# Patient Record
Sex: Male | Born: 1945 | Race: White | Hispanic: No | Marital: Single
Health system: Southern US, Community
[De-identification: ages and names within clinical notes are randomized; demographics above are authoritative.]

## PROBLEM LIST (undated history)

## (undated) DIAGNOSIS — I716 Thoracoabdominal aortic aneurysm, without rupture, unspecified: Secondary | ICD-10-CM

## (undated) DIAGNOSIS — J9612 Chronic respiratory failure with hypercapnia: Secondary | ICD-10-CM

## (undated) DIAGNOSIS — Z720 Tobacco use: Secondary | ICD-10-CM

## (undated) DIAGNOSIS — I2699 Other pulmonary embolism without acute cor pulmonale: Secondary | ICD-10-CM

## (undated) DIAGNOSIS — I70209 Unspecified atherosclerosis of native arteries of extremities, unspecified extremity: Secondary | ICD-10-CM

## (undated) DIAGNOSIS — J9611 Chronic respiratory failure with hypoxia: Secondary | ICD-10-CM

## (undated) DIAGNOSIS — I5030 Unspecified diastolic (congestive) heart failure: Secondary | ICD-10-CM

## (undated) DIAGNOSIS — I639 Cerebral infarction, unspecified: Secondary | ICD-10-CM

---

## 2019-04-10 ENCOUNTER — Emergency Department (HOSPITAL_COMMUNITY): Payer: Medicare Other

## 2019-04-10 ENCOUNTER — Inpatient Hospital Stay (HOSPITAL_COMMUNITY)
Admission: EM | Admit: 2019-04-10 | Discharge: 2019-04-12 | DRG: 189 | Disposition: A | Discharge status: Hospice | Payer: Medicare Other | Attending: Student | Admitting: Student

## 2019-04-10 ENCOUNTER — Other Ambulatory Visit: Payer: Self-pay

## 2019-04-10 ENCOUNTER — Encounter (HOSPITAL_COMMUNITY): Payer: Self-pay | Admitting: Pulmonary Disease

## 2019-04-10 DIAGNOSIS — Z515 Encounter for palliative care: Secondary | ICD-10-CM | POA: Diagnosis not present

## 2019-04-10 DIAGNOSIS — Z8673 Personal history of transient ischemic attack (TIA), and cerebral infarction without residual deficits: Secondary | ICD-10-CM | POA: Diagnosis not present

## 2019-04-10 DIAGNOSIS — Z6833 Body mass index (BMI) 33.0-33.9, adult: Secondary | ICD-10-CM

## 2019-04-10 DIAGNOSIS — E875 Hyperkalemia: Secondary | ICD-10-CM | POA: Diagnosis present

## 2019-04-10 DIAGNOSIS — I96 Gangrene, not elsewhere classified: Secondary | ICD-10-CM

## 2019-04-10 DIAGNOSIS — J441 Chronic obstructive pulmonary disease with (acute) exacerbation: Secondary | ICD-10-CM | POA: Diagnosis present

## 2019-04-10 DIAGNOSIS — I959 Hypotension, unspecified: Secondary | ICD-10-CM | POA: Diagnosis present

## 2019-04-10 DIAGNOSIS — J449 Chronic obstructive pulmonary disease, unspecified: Secondary | ICD-10-CM | POA: Diagnosis not present

## 2019-04-10 DIAGNOSIS — Z86711 Personal history of pulmonary embolism: Secondary | ICD-10-CM

## 2019-04-10 DIAGNOSIS — I70262 Atherosclerosis of native arteries of extremities with gangrene, left leg: Secondary | ICD-10-CM | POA: Diagnosis present

## 2019-04-10 DIAGNOSIS — I716 Thoracoabdominal aortic aneurysm, without rupture: Secondary | ICD-10-CM | POA: Diagnosis present

## 2019-04-10 DIAGNOSIS — I2782 Chronic pulmonary embolism: Secondary | ICD-10-CM | POA: Diagnosis not present

## 2019-04-10 DIAGNOSIS — I361 Nonrheumatic tricuspid (valve) insufficiency: Secondary | ICD-10-CM | POA: Diagnosis not present

## 2019-04-10 DIAGNOSIS — G4733 Obstructive sleep apnea (adult) (pediatric): Secondary | ICD-10-CM | POA: Diagnosis present

## 2019-04-10 DIAGNOSIS — J9621 Acute and chronic respiratory failure with hypoxia: Secondary | ICD-10-CM | POA: Diagnosis present

## 2019-04-10 DIAGNOSIS — J9602 Acute respiratory failure with hypercapnia: Secondary | ICD-10-CM | POA: Diagnosis not present

## 2019-04-10 DIAGNOSIS — Z7982 Long term (current) use of aspirin: Secondary | ICD-10-CM | POA: Diagnosis not present

## 2019-04-10 DIAGNOSIS — N170 Acute kidney failure with tubular necrosis: Secondary | ICD-10-CM | POA: Diagnosis present

## 2019-04-10 DIAGNOSIS — L03116 Cellulitis of left lower limb: Secondary | ICD-10-CM | POA: Diagnosis present

## 2019-04-10 DIAGNOSIS — E669 Obesity, unspecified: Secondary | ICD-10-CM | POA: Diagnosis present

## 2019-04-10 DIAGNOSIS — Z720 Tobacco use: Secondary | ICD-10-CM | POA: Diagnosis present

## 2019-04-10 DIAGNOSIS — J9622 Acute and chronic respiratory failure with hypercapnia: Secondary | ICD-10-CM | POA: Diagnosis present

## 2019-04-10 DIAGNOSIS — Z7901 Long term (current) use of anticoagulants: Secondary | ICD-10-CM | POA: Diagnosis not present

## 2019-04-10 DIAGNOSIS — I70201 Unspecified atherosclerosis of native arteries of extremities, right leg: Secondary | ICD-10-CM | POA: Diagnosis present

## 2019-04-10 DIAGNOSIS — I110 Hypertensive heart disease with heart failure: Secondary | ICD-10-CM | POA: Diagnosis present

## 2019-04-10 DIAGNOSIS — R0602 Shortness of breath: Secondary | ICD-10-CM | POA: Diagnosis present

## 2019-04-10 DIAGNOSIS — E872 Acidosis: Secondary | ICD-10-CM | POA: Diagnosis present

## 2019-04-10 DIAGNOSIS — I5032 Chronic diastolic (congestive) heart failure: Secondary | ICD-10-CM | POA: Diagnosis present

## 2019-04-10 DIAGNOSIS — Z20828 Contact with and (suspected) exposure to other viral communicable diseases: Secondary | ICD-10-CM | POA: Diagnosis present

## 2019-04-10 DIAGNOSIS — I712 Thoracic aortic aneurysm, without rupture: Secondary | ICD-10-CM | POA: Diagnosis present

## 2019-04-10 DIAGNOSIS — L0390 Cellulitis, unspecified: Secondary | ICD-10-CM | POA: Diagnosis not present

## 2019-04-10 DIAGNOSIS — I739 Peripheral vascular disease, unspecified: Secondary | ICD-10-CM | POA: Diagnosis not present

## 2019-04-10 DIAGNOSIS — Z86718 Personal history of other venous thrombosis and embolism: Secondary | ICD-10-CM

## 2019-04-10 DIAGNOSIS — I2699 Other pulmonary embolism without acute cor pulmonale: Secondary | ICD-10-CM | POA: Diagnosis present

## 2019-04-10 DIAGNOSIS — N179 Acute kidney failure, unspecified: Secondary | ICD-10-CM | POA: Diagnosis not present

## 2019-04-10 DIAGNOSIS — Z66 Do not resuscitate: Secondary | ICD-10-CM | POA: Diagnosis present

## 2019-04-10 DIAGNOSIS — I724 Aneurysm of artery of lower extremity: Secondary | ICD-10-CM | POA: Diagnosis present

## 2019-04-10 DIAGNOSIS — J9601 Acute respiratory failure with hypoxia: Secondary | ICD-10-CM

## 2019-04-10 DIAGNOSIS — Z7951 Long term (current) use of inhaled steroids: Secondary | ICD-10-CM

## 2019-04-10 HISTORY — DX: Thoracoabdominal aortic aneurysm, without rupture: I71.6

## 2019-04-10 HISTORY — DX: Chronic respiratory failure with hypercapnia: J96.12

## 2019-04-10 HISTORY — DX: Tobacco use: Z72.0

## 2019-04-10 HISTORY — DX: Other pulmonary embolism without acute cor pulmonale: I26.99

## 2019-04-10 HISTORY — DX: Unspecified diastolic (congestive) heart failure: I50.30

## 2019-04-10 HISTORY — DX: Chronic respiratory failure with hypoxia: J96.11

## 2019-04-10 HISTORY — DX: Cerebral infarction, unspecified: I63.9

## 2019-04-10 HISTORY — DX: Thoracoabdominal aortic aneurysm, without rupture, unspecified: I71.60

## 2019-04-10 HISTORY — DX: Unspecified atherosclerosis of native arteries of extremities, unspecified extremity: I70.209

## 2019-04-10 LAB — COMPREHENSIVE METABOLIC PANEL
ALT: 15 U/L (ref 0–44)
AST: 15 U/L (ref 15–41)
Albumin: 3 g/dL — ABNORMAL LOW (ref 3.5–5.0)
Alkaline Phosphatase: 59 U/L (ref 38–126)
Anion gap: 13 (ref 5–15)
BUN: 31 mg/dL — ABNORMAL HIGH (ref 8–23)
CO2: 24 mmol/L (ref 22–32)
Calcium: 8.6 mg/dL — ABNORMAL LOW (ref 8.9–10.3)
Chloride: 101 mmol/L (ref 98–111)
Creatinine, Ser: 1.5 mg/dL — ABNORMAL HIGH (ref 0.61–1.24)
GFR calc Af Amer: 54 mL/min — ABNORMAL LOW (ref >60)
GFR calc non Af Amer: 46 mL/min — ABNORMAL LOW (ref >60)
Glucose, Bld: 89 mg/dL (ref 70–99)
Potassium: 5.6 mmol/L — ABNORMAL HIGH (ref 3.5–5.1)
Sodium: 138 mmol/L (ref 135–145)
Total Bilirubin: 0.4 mg/dL (ref 0.3–1.2)
Total Protein: 6.1 g/dL — ABNORMAL LOW (ref 6.5–8.1)

## 2019-04-10 LAB — POCT I-STAT EG7
Acid-base deficit: 1 mmol/L (ref 0.0–2.0)
Bicarbonate: 29.1 mmol/L — ABNORMAL HIGH (ref 20.0–28.0)
Calcium, Ion: 1.16 mmol/L (ref 1.15–1.40)
HCT: 48 % (ref 39.0–52.0)
Hemoglobin: 16.3 g/dL (ref 13.0–17.0)
O2 Saturation: 43 %
Potassium: 5.6 mmol/L — ABNORMAL HIGH (ref 3.5–5.1)
Sodium: 136 mmol/L (ref 135–145)
TCO2: 31 mmol/L (ref 22–32)
pCO2, Ven: 70.4 mmHg (ref 44.0–60.0)
pH, Ven: 7.225 — ABNORMAL LOW (ref 7.250–7.430)
pO2, Ven: 30 mmHg — CL (ref 32.0–45.0)

## 2019-04-10 LAB — CBC WITH DIFFERENTIAL/PLATELET
Abs Immature Granulocytes: 0.26 10*3/uL — ABNORMAL HIGH (ref 0.00–0.07)
Basophils Absolute: 0.1 10*3/uL (ref 0.0–0.1)
Basophils Relative: 1 %
Eosinophils Absolute: 0.3 10*3/uL (ref 0.0–0.5)
Eosinophils Relative: 2 %
HCT: 51.1 % (ref 39.0–52.0)
Hemoglobin: 15.1 g/dL (ref 13.0–17.0)
Immature Granulocytes: 2 %
Lymphocytes Relative: 10 %
Lymphs Abs: 1.5 10*3/uL (ref 0.7–4.0)
MCH: 30.1 pg (ref 26.0–34.0)
MCHC: 29.5 g/dL — ABNORMAL LOW (ref 30.0–36.0)
MCV: 101.8 fL — ABNORMAL HIGH (ref 80.0–100.0)
Monocytes Absolute: 1.1 10*3/uL — ABNORMAL HIGH (ref 0.1–1.0)
Monocytes Relative: 7 %
Neutro Abs: 12.4 10*3/uL — ABNORMAL HIGH (ref 1.7–7.7)
Neutrophils Relative %: 78 %
Platelets: 295 10*3/uL (ref 150–400)
RBC: 5.02 MIL/uL (ref 4.22–5.81)
RDW: 16 % — ABNORMAL HIGH (ref 11.5–15.5)
WBC: 15.7 10*3/uL — ABNORMAL HIGH (ref 4.0–10.5)
nRBC: 0 % (ref 0.0–0.2)

## 2019-04-10 LAB — TROPONIN I (HIGH SENSITIVITY)
Troponin I (High Sensitivity): 5 ng/L (ref <18)
Troponin I (High Sensitivity): <2 ng/L (ref <18)

## 2019-04-10 LAB — LACTIC ACID, PLASMA: Lactic Acid, Venous: 1.8 mmol/L (ref 0.5–1.9)

## 2019-04-10 LAB — SARS CORONAVIRUS 2 BY RT PCR (HOSPITAL ORDER, PERFORMED IN ~~LOC~~ HOSPITAL LAB): SARS Coronavirus 2: NEGATIVE

## 2019-04-10 LAB — APTT: aPTT: 34 seconds (ref 24–36)

## 2019-04-10 LAB — CBG MONITORING, ED: Glucose-Capillary: 77 mg/dL (ref 70–99)

## 2019-04-10 LAB — HEPARIN LEVEL (UNFRACTIONATED): Heparin Unfractionated: <0.1 IU/mL — ABNORMAL LOW (ref 0.30–0.70)

## 2019-04-10 LAB — PHOSPHORUS: Phosphorus: 1.4 mg/dL — ABNORMAL LOW (ref 2.5–4.6)

## 2019-04-10 LAB — BRAIN NATRIURETIC PEPTIDE: B Natriuretic Peptide: 60.2 pg/mL (ref 0.0–100.0)

## 2019-04-10 LAB — LIPASE, BLOOD: Lipase: 22 U/L (ref 11–51)

## 2019-04-10 LAB — MAGNESIUM: Magnesium: 0.6 mg/dL — CL (ref 1.7–2.4)

## 2019-04-10 MED ORDER — SODIUM CHLORIDE 0.9 % IV SOLN
2.0000 g | Freq: Once | INTRAVENOUS | Status: AC
  Administered 2019-04-10: 2 g via INTRAVENOUS
  Filled 2019-04-10: qty 20

## 2019-04-10 MED ORDER — ONDANSETRON HCL 4 MG PO TABS: 4.0000 mg | ORAL_TABLET | Freq: Four times a day (QID) | ORAL | Status: DC | PRN

## 2019-04-10 MED ORDER — HEPARIN BOLUS VIA INFUSION
5000.0000 [IU] | Freq: Once | INTRAVENOUS | Status: AC
  Administered 2019-04-10: 5000 [IU] via INTRAVENOUS
  Filled 2019-04-10: qty 5000

## 2019-04-10 MED ORDER — SODIUM CHLORIDE 0.9 % IV BOLUS
500.0000 mL | Freq: Once | INTRAVENOUS | Status: AC
  Administered 2019-04-10: 500 mL via INTRAVENOUS

## 2019-04-10 MED ORDER — VANCOMYCIN HCL 10 G IV SOLR
2000.0000 mg | Freq: Once | INTRAVENOUS | Status: AC
  Administered 2019-04-10: 2000 mg via INTRAVENOUS
  Filled 2019-04-10: qty 2000

## 2019-04-10 MED ORDER — VANCOMYCIN HCL 10 G IV SOLR
1250.0000 mg | INTRAVENOUS | Status: DC
  Filled 2019-04-10: qty 1250

## 2019-04-10 MED ORDER — IPRATROPIUM-ALBUTEROL 0.5-2.5 (3) MG/3ML IN SOLN
3.0000 mL | Freq: Four times a day (QID) | RESPIRATORY_TRACT | Status: DC
  Administered 2019-04-10 – 2019-04-12 (×6): 3 mL via RESPIRATORY_TRACT
  Filled 2019-04-10 (×6): qty 3

## 2019-04-10 MED ORDER — ONDANSETRON HCL 4 MG/2ML IJ SOLN: 4.0000 mg | Freq: Four times a day (QID) | INTRAMUSCULAR | Status: DC | PRN

## 2019-04-10 MED ORDER — IOHEXOL 350 MG/ML SOLN
125.0000 mL | Freq: Once | INTRAVENOUS | Status: AC | PRN
  Administered 2019-04-10: 125 mL via INTRAVENOUS

## 2019-04-10 MED ORDER — METOPROLOL TARTRATE 5 MG/5ML IV SOLN: 5.0000 mg | Freq: Once | INTRAVENOUS | Status: DC

## 2019-04-10 MED ORDER — MAGNESIUM SULFATE 2 GM/50ML IV SOLN
2.0000 g | Freq: Once | INTRAVENOUS | Status: AC
  Administered 2019-04-10: 2 g via INTRAVENOUS
  Filled 2019-04-10: qty 50

## 2019-04-10 MED ORDER — IPRATROPIUM-ALBUTEROL 0.5-2.5 (3) MG/3ML IN SOLN
3.0000 mL | RESPIRATORY_TRACT | Status: DC | PRN
  Administered 2019-04-12: 3 mL via RESPIRATORY_TRACT
  Filled 2019-04-10: qty 3

## 2019-04-10 MED ORDER — HEPARIN (PORCINE) 25000 UT/250ML-% IV SOLN
1500.0000 [IU]/h | INTRAVENOUS | Status: DC
  Administered 2019-04-10: 1550 [IU]/h via INTRAVENOUS
  Administered 2019-04-11: 11:00:00 1250 [IU]/h via INTRAVENOUS
  Administered 2019-04-12: 04:00:00 1500 [IU]/h via INTRAVENOUS
  Filled 2019-04-10 (×3): qty 250

## 2019-04-10 NOTE — ED Provider Notes (Signed)
[facility] EMERGENCY DEPARTMENT Provider Note   CSN: [id number] Arrival date & time: 04/10/19  1342     History   Chief Complaint Chief Complaint  Patient presents with   Leg Pain   Shortness of Breath    HPI Nathan Cantu is a 73 y.o. male.     Patient is a 73 year old male with history of PE, peripheral vascular disease who presents to the ED from podiatrist office for concern for worsening left lower leg extremity.  Per EMS patient was hypotensive in route, possibly hypoxic.  Patient overall poor historian.  He denies any chest pain, shortness of breath, abdominal pain.  States that he was recently in the hospital for treatment for his left lower leg.  He is unable to provide me much details.  He supposedly may be in hospice or palliative care but he cannot confirm that with me.  Patient likely also has a history of heart failure.  He confirms with me that he does not want CPR or to be put on life support.  He does not tell me about hospice or palliative care.  He states that he has been on antibiotics for his left foot.  The history is provided by the patient.  Illness Location:  General Severity:  Mild Onset quality:  Gradual Timing:  Constant Progression:  Worsening Chronicity:  Recurrent Relieved by:  Nothing Worsened by:  Othing Associated symptoms: shortness of breath (maybe?)   Associated symptoms: no abdominal pain, no chest pain, no cough, no ear pain, no fever, no rash, no sore throat and no vomiting     No past medical history on file.  There are no active problems to display for this patient.  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    Home Medications    Prior to Admission medications   Not on File    Family History No family history on file.  Social History Social History   Tobacco Use   Smoking status: Not on file  Substance Use Topics   Alcohol use: Not on  file   Drug use: Not on file     Allergies   Patient has no allergy information on record.   Review of Systems Review of Systems  Constitutional: Negative for chills and fever.  HENT: Negative for ear pain and sore throat.   Eyes: Negative for pain and visual disturbance.  Respiratory: Positive for shortness of breath (maybe?). Negative for cough.   Cardiovascular: Negative for chest pain and palpitations.  Gastrointestinal: Negative for abdominal pain and vomiting.  Genitourinary: Negative for dysuria and hematuria.  Musculoskeletal: Negative for arthralgias and back pain.  Skin: Positive for color change and wound. Negative for rash.  Neurological: Negative for seizures and syncope.  All other systems reviewed and are negative.    Physical Exam Updated Vital Signs  ED Triage Vitals [04/10/19 1358]  Enc Vitals Group     BP (!) 77/55     Pulse Rate (!) 113     Resp (!) 28     Temp 97.6 F (36.4 C)     Temp Source Oral     SpO2 (!) 82 %     Weight      Height      Head Circumference      Peak Flow      Pain Score      Pain Loc      Pain Edu?  Excl. in GC?     Physical Exam Vitals signs and nursing note reviewed.  Constitutional:      General: He is in acute distress.     Appearance: He is well-developed. He is obese. He is ill-appearing.  HENT:     Head: Normocephalic and atraumatic.     Mouth/Throat:     Mouth: Mucous membranes are moist.  Eyes:     Extraocular Movements: Extraocular movements intact.     Conjunctiva/sclera: Conjunctivae normal.     Pupils: Pupils are equal, round, and reactive to light.  Neck:     Musculoskeletal: Normal range of motion and neck supple.  Cardiovascular:     Rate and Rhythm: Normal rate and regular rhythm.     Heart sounds: Normal heart sounds. No murmur.     Comments: Unable to feel pulses bilaterally, no Doppler signal in the left foot, possibly Doppler signal in the right foot Pulmonary:     Effort: No  respiratory distress.     Breath sounds: Decreased breath sounds and rhonchi present.  Abdominal:     Palpations: Abdomen is soft.     Tenderness: There is no abdominal tenderness.  Musculoskeletal: Normal range of motion.     Right lower leg: Edema (2+) present.     Left lower leg: Edema (2+) present.  Skin:    General: Skin is warm and dry.     Capillary Refill: Capillary refill takes less than 2 seconds.  Neurological:     General: No focal deficit present.     Mental Status: He is alert and oriented to person, place, and time.     Cranial Nerves: No cranial nerve deficit.     Motor: No weakness.  Psychiatric:        Mood and Affect: Mood normal.      ED Treatments / Results  Labs (all labs ordered are listed, but only abnormal results are displayed) Labs Reviewed  CBC WITH DIFFERENTIAL/PLATELET - Abnormal; Notable for the following components:      Result Value   WBC 15.7 (*)    MCV 101.8 (*)    MCHC 29.5 (*)    RDW 16.0 (*)    Neutro Abs 12.4 (*)    Monocytes Absolute 1.1 (*)    Abs Immature Granulocytes 0.26 (*)    All other components within normal limits  COMPREHENSIVE METABOLIC PANEL - Abnormal; Notable for the following components:   Potassium 5.6 (*)    BUN 31 (*)    Creatinine, Ser 1.50 (*)    Calcium 8.6 (*)    Total Protein 6.1 (*)    Albumin 3.0 (*)    GFR calc non Af Amer 46 (*)    GFR calc Af Amer 54 (*)    All other components within normal limits  POCT I-STAT EG7 - Abnormal; Notable for the following components:   pH, Ven 7.225 (*)    pCO2, Ven 70.4 (*)    pO2, Ven 30.0 (*)    Bicarbonate 29.1 (*)    Potassium 5.6 (*)    All other components within normal limits  SARS CORONAVIRUS 2 BY RT PCR (HOSPITAL ORDER, PERFORMED IN [facility])  CULTURE, BLOOD (ROUTINE X 2)  CULTURE, BLOOD (ROUTINE X 2)  URINE CULTURE  LIPASE, BLOOD  LACTIC ACID, PLASMA  BRAIN NATRIURETIC PEPTIDE  LACTIC ACID, PLASMA  URINALYSIS, ROUTINE W REFLEX  MICROSCOPIC  HEPARIN LEVEL (UNFRACTIONATED)  APTT  I-STAT VENOUS BLOOD GAS, ED  CBG  MONITORING, ED  TROPONIN I (HIGH SENSITIVITY)  TROPONIN I (HIGH SENSITIVITY)    EKG EKG Interpretation  Date/Time:  Thursday April 10 2019 13:46:49 EDT Ventricular Rate:  105 PR Interval:    QRS Duration: 94 QT Interval:  324 QTC Calculation: 429 R Axis:   -166 Text Interpretation:  Sinus tachycardia Anterior infarct, old No significant change since last tracing Confirmed by Marily Memos [phone]) on 04/10/2019 3:43:09 PM   Radiology Dg Chest Portable 1 View  Result Date: 04/10/2019 CLINICAL DATA:  73 year old male with shortness of breath. EXAM: PORTABLE CHEST 1 VIEW COMPARISON:  Chest radiograph dated 03/27/2019 FINDINGS: There is eventration of the left hemidiaphragm similar to prior radiograph. There is diffuse interstitial and vascular prominence which may represent a degree of congestion and edema. Pneumonia is not excluded. Clinical correlation is recommended. There is no large pleural effusion. No pneumothorax. Stable cardiomegaly. No acute osseous pathology. IMPRESSION: Cardiomegaly with findings of possible CHF. Pneumonia is not excluded. Clinical correlation is recommended. Electronically Signed   By: Elgie Collard M.D.   On: 04/10/2019 15:08   Dg Foot 2 Views Left  Result Date: 04/10/2019 CLINICAL DATA:  Foot pain. EXAM: LEFT FOOT - 2 VIEW COMPARISON:  None. FINDINGS: There is no evidence of fracture or dislocation. There is no evidence of arthropathy or other focal bone abnormality. Soft tissues are unremarkable. IMPRESSION: Negative. Electronically Signed   By: Lupita Raider M.D.   On: 04/10/2019 15:07    Procedures .Critical Care Performed by: Virgina Norfolk, DO Authorized by: Virgina Norfolk, DO   Critical care provider statement:    Critical care time (minutes):  80   Critical care was necessary to treat or prevent imminent or life-threatening deterioration of the  following conditions:  Circulatory failure and respiratory failure   Critical care was time spent personally by me on the following activities:  Blood draw for specimens, development of treatment plan with patient or surrogate, discussions with consultants, discussions with primary provider, evaluation of patient's response to treatment, obtaining history from patient or surrogate, ordering and performing treatments and interventions, ordering and review of laboratory studies, ordering and review of radiographic studies, pulse oximetry, re-evaluation of patient's condition and review of old charts   I assumed direction of critical care for this patient from another provider in my specialty: no     (including critical care time)  Medications Ordered in ED Medications  vancomycin (VANCOCIN) 2,000 mg in sodium chloride 0.9 % 500 mL IVPB (2,000 mg Intravenous New Bag/Given 04/10/19 1544)  cefTRIAXone (ROCEPHIN) 2 g in sodium chloride 0.9 % 100 mL IVPB (has no administration in time range)  sodium chloride 0.9 % bolus 500 mL (0 mLs Intravenous Stopped 04/10/19 1524)     Initial Impression / Assessment and Plan / ED Course  I have reviewed the triage vital signs and the nursing notes.  Pertinent labs & imaging results that were available during my care of the patient were reviewed by me and considered in my medical decision making (see chart for details).     Nathan Cantu is a 73 year old male with history of heart failure, pulmonary embolism on anticoagulation, peripheral vascular disease with known occlusion, who presents the ED with left foot pain from podiatry's office.  Patient per EMS has had hypotension, hypoxia.  Patient not chronically on oxygen.  Overall patient is an extremely poor historian.  EMS unable to give much history.  Patient overall appears chronically ill.  After extensive chart review it appears  that patient was recently admitted to [facility] ICU for acute PE,  acute occlusion of left SFA, respiratory failure.  Was also treated for cellulitis of the left foot.  He was followed up with podiatry today but it appeared that his infection had gotten worse and podiatry wanted him to come to the emergency department for evaluation.  Patient does not want to be treated at [facility] facility at this time upon my evaluation.  After learning about this information it appears that patient may just have progression of these disease processes.  I am not sure if patient is taken his Eliquis as he was prescribed at discharge.  There is a question of whether or not the patient is on hospice.  He confirms to me that he wants to be DNR, does not want CPR does not want intubation.  However he does want antibiotics and possible surgery if he is a candidate.  Patient appears to be normotensive upon arrival after IV fluids with EMS.  Patient has good oxygenation on 3 L of oxygen but he is somnolent.  He is overall difficult to address.  He is willing to stay for work-up and for IV antibiotics and likely surgery consultation.  He appears to have an infection to his left foot.  I am unable to Doppler a pulse in his left foot and suspect that he has ongoing peripheral vascular disease.  I talked with Dr. Brooke Dare with vascular surgery and we will start IV heparin for presumed PEs and vascular disease.  He has chronic thoracic abdominal aneurysm as well that appeared to be fairly stable on recent CT scan.  Patient does not have any severe chest pain.  We will empirically start IV antibiotics for left foot infection.  Anticipate use of BiPAP on patient if needed.  Will do CT scan of his abdomen and pelvis with runoff to evaluate for vascular disease.  We will get a CT PE scan.  Sepsis work-up initiated with blood cultures, coronavirus testing, urine studies, chest x-ray.  Patient with normal lactic acid.  Normal BNP.  Troponin normal.  Creatinine mildly elevated at 1.5.  However no other electrolyte  abnormalities.  Patient has leukocytosis of 15.7.  No fever.  Chest x-ray shows cardiomegaly with possibly pneumonia versus volume overload.  Foot x-ray is unremarkable.  Blood gas shows mild acidosis with hypercarbia.  Covid test is negative.  Will order BiPAP if patient will tolerate it.  Overall suspect that patient likely has infectious process in the left foot.  Likely ongoing acute to subacute PEs and likely ongoing occlusion to left lower extremity blood vessels.  Patient was handed off to Dr. Clayborne Dana while awaiting CT scan of chest abdomen and pelvis.  Hemodynamically improved throughout my care following fluids, oxygen.  ICU teamis aware of patient as well from a pulmonology standpoint.  However, patient is DNR, does not want intubation or CPR.  Case management will try to attempt to talk to his suppose it hospice care [place].  This chart was dictated using voice recognition software.  Despite best efforts to proofread,  errors can occur which can change the documentation meaning.  Nathan Cantu was evaluated in Emergency Department on 04/10/2019 for the symptoms described in the history of present illness. He was evaluated in the context of the global COVID-19 pandemic, which necessitated consideration that the patient might be at risk for infection with the SARS-CoV-2 virus that causes COVID-19. Institutional protocols and algorithms that pertain to the evaluation  of patients at risk for COVID-19 are in a state of rapid change based on information released by regulatory bodies including the CDC and federal and state organizations. These policies and algorithms were followed during the patient's care in the ED.   Final Clinical Impressions(s) / ED Diagnoses   Final diagnoses:  Acute respiratory failure with hypoxia (HCC)  Cellulitis, unspecified cellulitis site    ED Discharge Orders    None       Lennice Sites, DO 04/10/19 1629

## 2019-04-10 NOTE — ED Provider Notes (Signed)
11:15 PM Assumed care from Dr. Ronnald Nian, please see their note for full history, physical and decision making until this point. In brief this is a 73 y.o. year old male who presented to the ED tonight with Leg Pain and Shortness of Breath     Multiple body aneurysms and occluded arteries.  Patient is DNR.  Start antibiotics, fluids vascular surgery consulted and will follow up images.  Pending labs for admission. Labs as below. CCM consulted for admission.   Labs, studies and imaging reviewed by myself and considered in medical decision making if ordered. Imaging interpreted by radiology.  Labs Reviewed  CBC WITH DIFFERENTIAL/PLATELET - Abnormal; Notable for the following components:      Result Value   WBC 15.7 (*)    MCV 101.8 (*)    MCHC 29.5 (*)    RDW 16.0 (*)    Neutro Abs 12.4 (*)    Monocytes Absolute 1.1 (*)    Abs Immature Granulocytes 0.26 (*)    All other components within normal limits  COMPREHENSIVE METABOLIC PANEL - Abnormal; Notable for the following components:   Potassium 5.6 (*)    BUN 31 (*)    Creatinine, Ser 1.50 (*)    Calcium 8.6 (*)    Total Protein 6.1 (*)    Albumin 3.0 (*)    GFR calc non Af Amer 46 (*)    GFR calc Af Amer 54 (*)    All other components within normal limits  MAGNESIUM - Abnormal; Notable for the following components:   Magnesium 0.6 (*)    All other components within normal limits  PHOSPHORUS - Abnormal; Notable for the following components:   Phosphorus 1.4 (*)    All other components within normal limits  HEPARIN LEVEL (UNFRACTIONATED) - Abnormal; Notable for the following components:   Heparin Unfractionated <0.10 (*)    All other components within normal limits  POCT I-STAT EG7 - Abnormal; Notable for the following components:   pH, Ven 7.225 (*)    pCO2, Ven 70.4 (*)    pO2, Ven 30.0 (*)    Bicarbonate 29.1 (*)    Potassium 5.6 (*)    All other components within normal limits  SARS CORONAVIRUS 2 BY RT PCR (HOSPITAL ORDER,  [place] [facility])  CULTURE, BLOOD (ROUTINE X 2)  CULTURE, BLOOD (ROUTINE X 2)  URINE CULTURE  LIPASE, BLOOD  LACTIC ACID, PLASMA  BRAIN NATRIURETIC PEPTIDE  APTT  LACTIC ACID, PLASMA  URINALYSIS, ROUTINE W REFLEX MICROSCOPIC  HEPARIN LEVEL (UNFRACTIONATED)  COMPREHENSIVE METABOLIC PANEL  CBC  APTT  HEPARIN LEVEL (UNFRACTIONATED)  I-STAT VENOUS BLOOD GAS, ED  CBG MONITORING, ED  TROPONIN I (HIGH SENSITIVITY)  TROPONIN I (HIGH SENSITIVITY)    DG Chest Portable 1 View  Final Result    DG Foot 2 Views Left  Final Result    CT Angio Chest PE W and/or Wo Contrast    (Results Pending)  CT Angio Aortobifemoral W and/or Wo Contrast    (Results Pending)    No follow-ups on file.    Merrily Pew, MD 04/10/19 [phone]

## 2019-04-10 NOTE — Consult Note (Signed)
NAME:  Nathan Cantu, MRN:  [id number], DOB:  [birth year], LOS: 0 ADMISSION DATE:  04/10/2019, CONSULTATION DATE:  10/15 REFERRING MD:  Dr. Dayna Barker, CHIEF COMPLAINT:  SOB  Brief History   73 year old male with multiple medical problems admitted 10/15 with complaints or SOB and leg pain. Recent Dx PE as well. Pulmonary consultation requested for evaluation of dyspnea.   History of present illness   73 year old with multiple medical problems. He was recently admitted to [facility] on 10/2 where he underwent treatment for new arterial occlusion of left SFA with ischemic toes, mixed respiratory failure requiring BiPAP, PE, and COPD exacerbation. Vascular surgery evaluated him at that time and initially recommended conservative management with anti-coagulation. They then determined he would likely require toe amputation,which the patient refused at that time. He was also found to have PE at that time. He was discharged on eliquis. He was ultimately seen by palliative care and was discharged on home hospice services 10/9. Then 10/15 he was referred to [facility] ED from podiatry office with complaints of left leg pain and dyspnea. There was concern for worsening of his known vascular injury and the appearance of infected foot. Vascuar surgery considering a trip to the OR for SFA stent and possible toe amputation. Became somnolent in ED. ABG showed hypercarbia. BiPAP pending COVID test results.   PCCM consulted for known PE history and pre op clearance.   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  Significant Hospital Events   [facility] admit 10/2-10/9 Admit 10/15  Consults:  Vascular PCCM  Procedures:      Significant Diagnostic Tests:  CTA chest 10/15 > CT abdomen pelvis 10/15 >  Micro Data:  BCx2 10/15 > Urine 10/15 >  Antimicrobials:  Vancomycin 10/15 > Rocephin 10/15 >  Interim history/subjective:  Admitted.  Objective   Blood pressure 93/63, pulse 95, temperature 97.6 F (36.4 C), temperature source Oral, resp. rate 18, SpO2 98 %.       No intake or output data in the 24 hours ending 04/10/19 1706 There were no vitals filed for this visit.  Examination: GEN: obese chronically ill appearing man in NAD HEENT: NRB in place, malampatti 3 CV: Heart sounds borderline tachycardic, ext warm PULM: Shallow inspiratory effort, no wheezing GI: Protuberant, soft EXT: 1+ edema on L, trace on R, left foot erythmatous with ulcer of left toe NEURO: moves all 4 ext to command PSYCH: oriented but lacks insight SKIN: chronic vascular insiffuciency changes   Resolved Hospital Problem list   NA  Assessment & Plan:  73 yo M w/ recent admission to [facility] with pulmonary embolism, cellulitis, SFA occlusion, thoracoabdominal aneurysm. Refused intervention at [facility] and discharged with palliative care. Refused to return to [facility] 10/15 and presents to [facility] with foot pain and SOB. Sounds like his recent admission to [place] was a bit of a mess due to patient cooperation with treatment plan.  Chronic hypercapnic respiratory failure L hemidiaphragm elevation Reported hx of COPD, no PFTs on file Active smoker -previous L hydropneumothorax thought to be related to bad pancreatitis back in 2018.  This was left alone and allowed to scar down likely leading to current presentation.  P -follow up CT chest -would benefit from BiPAP if covid-19 returns negative -  Nebs  Pulmonary Embolism  Recent DVT  -PE discovered at [facility], RH looked okay on echo  P -Heparin gtt  -Repeat imaging is pending although not sure what to do with this information  Peripheral vascular disease with occlusion of L SFA   Thoracoabdominal aneurysm with chronic dissection P -Vascular following    Hx cerebellar CVA feb 2020 P -Per Primary -Supportive care   Goals of Care Patient tells me he is fine for intubation short term if needed for surgery but does not want chest compressions or shocks.  Per prior palliative note at [facility]: "Mr. Beverely Pacelbertson is a 73 yo male with a medical history of obesity, tobacco abuse, CHFpEF, COPD, chronic thoracoabdominal dissection, CVA (right cerebellar 07/2018). He was admitted to [facility] on 10/1 with increasing left foot pain. He was found on CTA to have arterial occlusion from the SFA to the popliteal artery on the left as well as occlusion of the DP artery on the left, and started on heparin infusion. He was [facility] for further vascular evaluation. He required ICU admission for hypoxic and hypercapnic resp failure with somnolence, requiring steroids and bipap. CTA chest showed bilateral PE's. He was seen in consult by vascular surgery and he was offered offered open popliteal aneurysm bypass once medically optimized, which he refused. He was also offered amputation which he has refused. He has declined several tests (such as surface echo). He is being treated with doxy and cipro for cellulitis and copd exac. He was started on eliquis for PE. "  Pre op evaluation Patient obviously at higher risk due to body habitus and recent PE but no further pulmonary testing needs to be done prior to surgery  If that is what is needed for pain control and wound healing.  We will be happy to follow him postop as needed.  Best practice:  Diet: NPO Pain/Anxiety/Delirium protocol (if indicated): na  VAP protocol (if indicated): na DVT prophylaxis: heparin gtt Glucose control: monitor  Mobility: bedrest  Code Status: DNR  Family Communication: per primary Disposition: hemodynamically stable for admission to SDU with Hospitalist team   Labs   CBC: Recent Labs  Lab  04/10/19 1439 04/10/19 1521  WBC 15.7*  --   NEUTROABS 12.4*  --   HGB 15.1 16.3  HCT 51.1 48.0  MCV 101.8*  --   PLT 295  --     Basic Metabolic Panel: Recent Labs  Lab 04/10/19 1439 04/10/19 1521  NA 138 136  K 5.6* 5.6*  CL 101  --   CO2 24  --   GLUCOSE 89  --   BUN 31*  --   CREATININE 1.50*  --   CALCIUM 8.6*  --    GFR: CrCl cannot be calculated (Unknown ideal weight.). Recent Labs  Lab 04/10/19 1439 04/10/19 1441  WBC 15.7*  --   LATICACIDVEN  --  1.8    Liver Function Tests: Recent Labs  Lab 04/10/19 1439  AST 15  ALT 15  ALKPHOS 59  BILITOT 0.4  PROT 6.1*  ALBUMIN 3.0*   Recent Labs  Lab 04/10/19 1439  LIPASE 22   No results for input(s): AMMONIA in the last 168 hours.  ABG    Component Value Date/Time   HCO3 29.1 (H) 04/10/2019 1521   TCO2 31 04/10/2019 1521   ACIDBASEDEF 1.0 04/10/2019 1521   O2SAT 43.0 04/10/2019 1521     Coagulation Profile: No results for input(s): INR, PROTIME in the last  168 hours.  Cardiac Enzymes: No results for input(s): CKTOTAL, CKMB, CKMBINDEX, TROPONINI in the last 168 hours.  HbA1C: No results found for: HGBA1C  CBG: Recent Labs  Lab 04/10/19 1416  GLUCAP 77    Review of Systems:    Positive Symptoms in bold:  Constitutional fevers, chills, weight loss, fatigue, anorexia, malaise  Eyes decreased vision, double vision, eye irritation  Ears, Nose, Mouth, Throat sore throat, trouble swallowing, sinus congestion  Cardiovascular chest pain, paroxysmal nocturnal dyspnea, lower ext edema, palpitations   Respiratory SOB, cough, DOE, hemoptysis, wheezing  Gastrointestinal nausea, vomiting, diarrhea  Genitourinary burning with urination, trouble urinating  Musculoskeletal joint aches, joint swelling, back pain  Integumentary  rashes, skin lesions  Neurological focal weakness, focal numbness, trouble speaking, headaches  Psychiatric depression, anxiety, confusion  Endocrine polyuria,  polydipsia, cold intolerance, heat intolerance  Hematologic abnormal bruising, abnormal bleeding, unexplained nose bleeds  Allergic/Immunologic recurrent infections, hives, swollen lymph nodes     Past Medical History  He,  has a past medical history of (HFpEF) heart failure with preserved ejection fraction (HCC), Cerebellar stroke (HCC), Chronic hypercapnic respiratory failure (HCC), Chronic hypoxemic respiratory failure (HCC), Femoral artery occlusion (HCC), Pulmonary embolism (HCC), Thoracoabdominal aneurysm (HCC), and Tobacco abuse.   Surgical History   Tonsillectomy  Social History  25 pack year smoker, still smoking Denies etoh  Family History   His family history is not on file.   Allergies Not on File   Home Medications  Prior to Admission medications   Not on File

## 2019-04-10 NOTE — ED Notes (Signed)
Pt with [facility]

## 2019-04-10 NOTE — H&P (Signed)
History and Physical    Nathan Cantu MLY:[id number] DOB: 24-Nov-1945 DOA: 04/10/2019  PCP: Judd Lien, PA-C  Patient coming from: Home  I have personally briefly reviewed patient's old medical records in [facility]  Chief Complaint: Shortness of breath and left foot pain  HPI: Nathan Cantu is a 73 y.o. male with medical history significant of cerebellar stroke, tobacco abuse, COPD, severe peripheral vascular disease with known occlusion, pulmonary embolism on Eliquis brought to emergency department from podiatry's office due to shortness of breath and left foot pain.   Patient recently admitted to [facility] on 03/28/2019 for acute PE, acute occlusion of left SFA, respiratory failure requiring BiPAP and cellulitis of left foot.  Vascular surgery was consulted at that time however patient refused for  surgery.  He was seen by palliative care and was discharged on home hospice on 04/04/2019.    He had follow-up appointment with podiatrist this morning however due to worsening left foot swelling and pain he was advised to go to the emergency department for further evaluation and management.  EDP consulted vascular surgery who recommended left SFA stenting possible toe amputation if patient agrees for the procedure however he became drowsy and ABG was obtained which showed hypercarbia.  He was placed on BiPAP.  COVID-19 came back negative.  Initially PCCM was consulted for the admission who deferred admission to the hospitalist team as patient is hemodynamically stable and does not want intubation or CPR.  EDP discussed CODE STATUS with the patient  and patient wants to be DNR, no CPR, no intubation.  Agreed with IV antibiotics and possible surgery if necessary.  Upon my evaluation: Patient was on BiPAP.  Drowsy but arousable.  Not very good historian.  History gathered from EDP and charts.  ED Course: Upon arrival: Patient was hypotensive with blood pressure  and  hypoxic.  He was given IV fluids and placed on 3 L of oxygen via nasal cannula.  Patient has leukocytosis of 15.7.  Afebrile, chest x-ray shows cardiomegaly with possible pneumonia versus volume overload.  X-ray of left foot came back negative.  Blood gas shows mild acidosis with hypercarbia.  COVID-19: Negative.  Patient placed on BiPAP.  Patient was started on heparin drip.   Review of Systems: As per HPI otherwise negative.    Past Medical History:  Diagnosis Date  . (HFpEF) heart failure with preserved ejection fraction (HCC)   . Cerebellar stroke (HCC)   . Chronic hypercapnic respiratory failure (HCC)   . Chronic hypoxemic respiratory failure (HCC)   . Femoral artery occlusion (HCC)   . Pulmonary embolism (HCC)   . Thoracoabdominal aneurysm (HCC)   . Tobacco abuse     History reviewed. No pertinent surgical history.   has no history on file for tobacco, alcohol, and drug.  Not on File  Family History  Family history unknown: Yes    Prior to Admission medications   Medication Sig Start Date End Date Taking? Authorizing Provider  Albuterol Sulfate 2.5 MG/0.5ML NEBU Inhale into the lungs.    [provider]  amLODIPine Besylate (NORVASC PO) Take by mouth.    [provider]  apixaban (ELIQUIS) 5 MG TABS tablet Take 5 mg by mouth 2 (two) times daily.    [provider]  arformoterol (BROVANA) 15 MCG/2ML NEBU Take 15 mcg by nebulization.    [provider]  aspirin 81 MG chewable tablet     [provider]  budesonide (PULMICORT) 0.5  MG/2ML nebulizer solution Take 0.5 mg by nebulization.    [provider]  lidocaine (XYLOCAINE) 5 % ointment Apply 1 application topically as needed.    [provider]  morphine (MSIR) 30 MG tablet 30 mg.    [provider]  oxyCODONE (OXY IR/ROXICODONE) 5 MG immediate release tablet Take 5 mg by mouth.    [provider]  polyethylene glycol (MIRALAX / GLYCOLAX) 17  g packet 17 g.    [provider]    Physical Exam: Vitals:   04/10/19 1830 04/10/19 1845 04/10/19 1900 04/10/19 1923  BP: 106/66 (!) 107/59 108/60   Pulse: (!) 103 (!) 101 (!) 103 (!) 103  Resp: 19 20 (!) 21 (!) 21  Temp:      TempSrc:      SpO2: 93% 94% 95% 94%  Weight:    108.9 kg  Height:    5' 10.98" (1.803 m)    Constitutional: NAD, calm, comfortable Vitals:   04/10/19 1830 04/10/19 1845 04/10/19 1900 04/10/19 1923  BP: 106/66 (!) 107/59 108/60   Pulse: (!) 103 (!) 101 (!) 103 (!) 103  Resp: 19 20 (!) 21 (!) 21  Temp:      TempSrc:      SpO2: 93% 94% 95% 94%  Weight:    108.9 kg  Height:    5' 10.98" (1.803 m)   Constitutional: Drowsy but arousable, not following commands, on BiPAP.   Eyes: PERRL, lids and conjunctivae normal ENMT: Mucous membranes are moist. Posterior pharynx clear of any exudate or lesions.Normal dentition.  Neck: normal, supple, no masses, no thyromegaly Respiratory: clear to auscultation bilaterally, no wheezing, no crackles. Normal respiratory effort. No accessory muscle use.  Cardiovascular: Regular rate and rhythm, no murmurs / rubs / gallops.  2+ pitting edema positive.  No carotid bruits.  Abdomen: no tenderness, no masses palpated. No hepatosplenomegaly. Bowel sounds positive.  Musculoskeletal: no clubbing / cyanosis. No joint deformity upper and lower extremities. Good ROM, no contractures. Normal muscle tone.  Skin: Left foot: Swollen, erythematous, left great toe: Infected, greenish discharge seen. neurologic: CN 2-12 grossly intact. Sensation intact, DTR normal. Strength 5/5 in all 4.  Psychiatric: Normal judgment and insight. Alert and oriented x 3. Normal mood.    Labs on Admission: I have personally reviewed following labs and imaging studies  CBC: Recent Labs  Lab 04/10/19 1439 04/10/19 1521  WBC 15.7*  --   NEUTROABS 12.4*  --   HGB 15.1 16.3  HCT 51.1 48.0  MCV 101.8*  --   PLT 295  --    Basic Metabolic Panel:  Recent Labs  Lab 04/10/19 1439 04/10/19 1521 04/10/19 1830  NA 138 136  --   K 5.6* 5.6*  --   CL 101  --   --   CO2 24  --   --   GLUCOSE 89  --   --   BUN 31*  --   --   CREATININE 1.50*  --   --   CALCIUM 8.6*  --   --   MG  --   --  0.6*  PHOS  --   --  1.4*   GFR: Estimated Creatinine Clearance: 56.7 mL/min (A) (by C-G formula based on SCr of 1.5 mg/dL (H)). Liver Function Tests: Recent Labs  Lab 04/10/19 1439  AST 15  ALT 15  ALKPHOS 59  BILITOT 0.4  PROT 6.1*  ALBUMIN 3.0*   Recent Labs  Lab 04/10/19 1439  LIPASE 22   No results for input(s): AMMONIA in the last 168 hours. Coagulation Profile: No results for input(s): INR, PROTIME in the last 168 hours. Cardiac Enzymes: No results for input(s): CKTOTAL, CKMB, CKMBINDEX, TROPONINI in the last 168 hours. BNP (last 3 results) No results for input(s): PROBNP in the last 8760 hours. HbA1C: No results for input(s): HGBA1C in the last 72 hours. CBG: Recent Labs  Lab 04/10/19 1416  GLUCAP 77   Lipid Profile: No results for input(s): CHOL, HDL, LDLCALC, TRIG, CHOLHDL, LDLDIRECT in the last 72 hours. Thyroid Function Tests: No results for input(s): TSH, T4TOTAL, FREET4, T3FREE, THYROIDAB in the last 72 hours. Anemia Panel: No results for input(s): VITAMINB12, FOLATE, FERRITIN, TIBC, IRON, RETICCTPCT in the last 72 hours. Urine analysis: No results found for: COLORURINE, APPEARANCEUR, LABSPEC, PHURINE, GLUCOSEU, HGBUR, BILIRUBINUR, KETONESUR, PROTEINUR, UROBILINOGEN, NITRITE, LEUKOCYTESUR  Radiological Exams on Admission: Dg Chest Portable 1 View  Result Date: 04/10/2019 CLINICAL DATA:  73 year old male with shortness of breath. EXAM: PORTABLE CHEST 1 VIEW COMPARISON:  Chest radiograph dated 03/27/2019 FINDINGS: There is eventration of the left hemidiaphragm similar to prior radiograph. There is diffuse interstitial and vascular prominence which may represent a degree of congestion and edema. Pneumonia is  not excluded. Clinical correlation is recommended. There is no large pleural effusion. No pneumothorax. Stable cardiomegaly. No acute osseous pathology. IMPRESSION: Cardiomegaly with findings of possible CHF. Pneumonia is not excluded. Clinical correlation is recommended. Electronically Signed   By: Elgie CollardArash  Radparvar M.D.   On: 04/10/2019 15:08   Dg Foot 2 Views Left  Result Date: 04/10/2019 CLINICAL DATA:  Foot pain. EXAM: LEFT FOOT - 2 VIEW COMPARISON:  None. FINDINGS: There is no evidence of fracture or dislocation. There is no evidence of arthropathy or other focal bone abnormality. Soft tissues are unremarkable. IMPRESSION: Negative. Electronically Signed   By: Lupita RaiderJames  Green Jr M.D.   On: 04/10/2019 15:07    EKG: Sinus tachycardia, no acute ST-T wave changes noted.  Assessment/Plan Principal Problem:   Acute respiratory failure with hypercapnia (HCC) Active Problems:   Pulmonary embolism (HCC)   COPD (chronic obstructive pulmonary disease) (HCC)   PVD (peripheral vascular disease) (HCC)   Tobacco abuse   Cellulitis   AKI (acute kidney injury) (HCC)   Hypomagnesemia   Acute respiratory failure with hypercapnia: -History of COPD and current smoker.  COVID-19: Negative. -Admit patient in the stepdown unit for close monitoring.  Leukocytosis of 15.7, patient is afebrile, lactic acid: WNL. -Currently on BiPAP, DuoNebs PRN. -Reviewed chest x-ray: Shows cardiomegaly with findings of possible CHF.  Pneumonia is not excluded.  proBNP: WNL. -Monitor vitals closely.  Keep him n.p.o. as patient is drowsy and he is on BiPAP.  Pulmonary embolism: -[facility]was discharged on Eliquis. -Repeat CT angiogram of chest was ordered however was not able to perform due to patient's mental status. -Continue heparin drip  Severe peripheral vascular disease: -With occlusion of left SFA, thoracoabdominal aneurysm -Vascular surgery on board-appreciate help  -Possible left SFA stenting with possible toe amputation once patient stabilizes. -Continue heparin drip  Cellulitis of left foot: -X-ray of left foot came back negative for acute findings. -Received Rocephin and IV vancomycin in the ED -We will continue same.  Blood culture is obtained and is pending.  Patient is afebrile, leukocytosis of 15.7.  Lactic acid: WNL.  History of cerebellar CVA in February 2020: -We will hold aspirin for now.  Hypertension: Currently blood pressure is on lower side -  Hold home dose of amlodipine at this time.  Monitor blood pressure closely.  AKI: Likely prerenal -Avoid nephrotoxic medication. -Monitor kidney function closely.  Hypomagnesemia: -Replaced.  Repeat level tomorrow a.m.  DVT prophylaxis: Heparin gtt\ Code Status: DNR Family Communication: None present at bedside.  Plan of care not discussed due to patient's condition  disposition Plan: To be determined Consults called: Vascular surgery Dr. Randie Heinz and PCCM Admission status: Inpatient   Ollen Bowl MD [facility] Pager [phone]  If 7PM-7AM, please contact night-coverage [email or web address] Password [email or web address]  04/10/2019, 8:05 PM

## 2019-04-10 NOTE — ED Notes (Signed)
Pt placed on NRB at this time. Pt oxygen saturations increased to 93%.

## 2019-04-10 NOTE — Consult Note (Signed)
Hospital Consult    Reason for Consult:  TAAA, pad Referring Physician:  Dr. Lockie Mola MRN #:  [id number]  History of Present Illness: This is a 73 y.o. male recently admitted to [facility]  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  Unknown if patient is currently taking Eliquis.  Has never had previous vascular surgery.  Denies any pain in his left foot.  Cannot tell me when his toe began giving him issues or when he began having cellulitis.  Does not know if he was taking antibiotics.  Possibly left AMA from [facility].  Denies diabetes states that he is a every day smoker.  No past medical history on file.  Patient cannot provide past medical or surgical history.  Social History   Socioeconomic History  . Marital status: Single    Spouse name: Not on file  . Number of children: Not on file  . Years of education: Not on file  . Highest education level: Not on file  Occupational History  . Not on file  Social Needs  . Financial resource strain: Not on file  . Food insecurity    Worry: Not on file    Inability: Not on file  . Transportation needs    Medical: Not on file    Non-medical: Not on file  Tobacco Use  . Smoking status: Not on file  Substance and Sexual Activity  . Alcohol use: Not on file  . Drug use: Not on file  . Sexual activity: Not on file  Lifestyle  . Physical activity    Days per week: Not on file    Minutes per session: Not on file  . Stress: Not on file  Relationships  . Social Musician on phone: Not on file    Gets together: Not on file    Attends religious service: Not on file    Active member of club or organization: Not on file    Attends meetings of clubs or organizations: Not on file    Relationship status: Not on file  . Intimate partner violence    Fear of current or ex partner: Not  on file    Emotionally abused: Not on file    Physically abused: Not on file    Forced sexual activity: Not on file  Other Topics Concern  . Not on file  Social History Narrative  . Not on file     No family history on file.  ROS:  Cardiovascular: []  chest pain/pressure []  palpitations [x]  SOB lying flat []  DOE []  pain in legs while walking []  pain in legs at rest []  pain in legs at night [x]  non-healing ulcers [x]  hx of DVT []  swelling in legs  Pulmonary: []  productive cough []  asthma/wheezing []  home O2  Neurologic: []  weakness in []  arms []  legs []  numbness in []  arms []  legs []  hx of CVA []  mini stroke [x] difficulty speaking or slurred speech []  temporary loss of vision in one eye []  dizziness  Hematologic: []  hx of cancer []  bleeding problems []  problems with blood clotting easily  Endocrine:   []  diabetes []  thyroid disease  GI []  vomiting blood []  blood in stool  GU: []  CKD/renal failure []  HD--[]  M/W/F or []  T/T/S []  burning with urination []  blood in urine  Psychiatric: []  anxiety []  depression  Musculoskeletal: []  arthritis []  joint pain  Integumentary: []  rashes []  ulcers  Constitutional: []  fever []  chills   Physical Examination  Vitals:   04/10/19 1430 04/10/19 1445  BP: 98/63 (!) 102/46  Pulse:  99  Resp: (!) 21 17  Temp:    SpO2:  93%   There is no height or weight on file to calculate BMI.  General: Patient short of breath at baseline Pulmonary: Respirations are labored Cardiac: I cannot readily feel femoral pulses this is likely due to patient body habitus.  There is a palpable right anterior tibial pulse.  There are left sided PT and AT monophasic signals at the ankle. Abdomen: soft, NT/ND, no masses Extremities: Left forefoot erythema that blanches.  Appears to have infection of left great toe. Neurologic: He is awake and alert does answer some questions but cannot give much in-depth history.  CBC     Component Value Date/Time   WBC 15.7 (H) 04/10/2019 1439   RBC 5.02 04/10/2019 1439   HGB 15.1 04/10/2019 1439   HCT 51.1 04/10/2019 1439   PLT 295 04/10/2019 1439   MCV 101.8 (H) 04/10/2019 1439   MCH 30.1 04/10/2019 1439   MCHC 29.5 (L) 04/10/2019 1439   RDW 16.0 (H) 04/10/2019 1439   LYMPHSABS 1.5 04/10/2019 1439   MONOABS 1.1 (H) 04/10/2019 1439   EOSABS 0.3 04/10/2019 1439   BASOSABS 0.1 04/10/2019 1439    BMET No results found for: NA, K, CL, CO2, GLUCOSE, BUN, CREATININE, CALCIUM, GFRNONAA, GFRAA  COAGS: No results found for: INR, PROTIME   Non-Invasive Vascular Imaging:   CTA pending  ASSESSMENT/PLAN: This is a 73 y.o. male known thoracoabdominal aneurysm from [facility] with gangrenous changes to the left great toe that appear to be at least subacute more likely chronic with CT scan demonstrating SFA occlusion in an outside institution.  I cannot see these images.  Should be okay for heparin given recent history of PE and aneurysm is chronic without any new back or abdominal pain history.  Patient will need goals of care discussion.  At the very most would consider left SFA stenting possible toe amputation if he does want intervention.  Will evaluate thoracoabdominal aneurysm patient does not appear to be candidate for major operation at this time.  Ketsia Linebaugh C. Donzetta Matters, MD [facility] [place] Office: [phone] Pager: [phone]

## 2019-04-10 NOTE — Progress Notes (Signed)
Pharmacy Antibiotic Note  Nathan Cantu is a 73 y.o. male admitted on 04/10/2019 with a left foot wound infection.  Pharmacy has been consulted for Vancomycin dosing. Pt completed a course of doxycycline prior to arrival, however patient has poor peripheral blood flow, which is likely impeding wound healing. There is also concern that the patient has a PE. CTA is pending. Pharmacy has been consulted to dose heparin.  Pt's WBC is elevated at 15.7. Temp is wnls. Pt is hemodynamically and has low O2 saturation. Lactate is elevated at 1.8. There has been a significant delay in obtaining baseline aPTT and anti-Xa level. Pt is a poor historian and unable to specify if he is on eliquis and when his last dose was. Medication history is unable to be completed due to patient's AMS and no records. CBC is wnls   Goal Heparin level 0.3 - 0.7 Monitor platelets by anticoagulation protocol: Yes  Plan: Vancomycin 2000 mg X 1 as loading dose. Vancomycin 1250 mg IV Q24 hrs. Goal AUC 400-550. Expected AUC: 547.5 (will likely decrease as renal function improves) SCr used: 1.5 Monitor WBC, Temp, Renal function, and Clinical status  Give heparin bolus of 5000 units IV x 1 Start heparin infusion at 1,550 units/hr Check anti-xa level in 8 hours and daily Check aPTT in 8 hours due to potential that the patient has taken eliquis. Monitor daily heparin level, aPTT, CBC, and signs/symptoms of bleeding.      Temp (24hrs), Avg:97.6 F (36.4 C), Min:97.6 F (36.4 C), Max:97.6 F (36.4 C)  Recent Labs  Lab 04/10/19 1439 04/10/19 1441  WBC 15.7*  --   LATICACIDVEN  --  1.8    CrCl cannot be calculated (No successful lab value found.).    Not on File  Antimicrobials this admission: Vancomycin 10/15 >>  Microbiology results: 10/15 BCx >> sent 10/15 UCx  >> sent 10/15 COVID >> sent  Thank you for allowing pharmacy to be a part of this patient's care.  Sherren Kerns, PharmD PGY1 Acute Care Pharmacy  Resident 04/10/2019 3:19 PM

## 2019-04-10 NOTE — ED Triage Notes (Signed)
Pt bib ems for difficulty breathing. Pt dx with DVT 4 days ago. Hx stroke with L sided defecits. Hypotensive on scene 72/39. Given 300cc NS en route. 116mcg fent en route. 99/67, HR 105, 94% 3L [place]. Pt is on hospice. Pt confused on arrival.

## 2019-04-11 ENCOUNTER — Inpatient Hospital Stay (HOSPITAL_COMMUNITY): Payer: Medicare Other

## 2019-04-11 ENCOUNTER — Encounter (HOSPITAL_COMMUNITY): Payer: Self-pay | Admitting: Radiology

## 2019-04-11 DIAGNOSIS — N179 Acute kidney failure, unspecified: Secondary | ICD-10-CM

## 2019-04-11 DIAGNOSIS — I2782 Chronic pulmonary embolism: Secondary | ICD-10-CM

## 2019-04-11 DIAGNOSIS — J449 Chronic obstructive pulmonary disease, unspecified: Secondary | ICD-10-CM

## 2019-04-11 DIAGNOSIS — I739 Peripheral vascular disease, unspecified: Secondary | ICD-10-CM

## 2019-04-11 DIAGNOSIS — I361 Nonrheumatic tricuspid (valve) insufficiency: Secondary | ICD-10-CM

## 2019-04-11 DIAGNOSIS — I716 Thoracoabdominal aortic aneurysm, without rupture: Secondary | ICD-10-CM

## 2019-04-11 DIAGNOSIS — L03116 Cellulitis of left lower limb: Secondary | ICD-10-CM

## 2019-04-11 DIAGNOSIS — Z720 Tobacco use: Secondary | ICD-10-CM

## 2019-04-11 LAB — BLOOD GAS, ARTERIAL
Acid-Base Excess: 3.3 mmol/L — ABNORMAL HIGH (ref 0.0–2.0)
Bicarbonate: 29.8 mmol/L — ABNORMAL HIGH (ref 20.0–28.0)
Delivery systems: POSITIVE
Drawn by: 246101
Expiratory PAP: 8
FIO2: 40
Inspiratory PAP: 18
Mode: POSITIVE
O2 Saturation: 94.3 %
Patient temperature: 97.9
pCO2 arterial: 66.6 mmHg (ref 32.0–48.0)
pH, Arterial: 7.271 — ABNORMAL LOW (ref 7.350–7.450)
pO2, Arterial: 75 mmHg — ABNORMAL LOW (ref 83.0–108.0)

## 2019-04-11 LAB — COMPREHENSIVE METABOLIC PANEL
ALT: 14 U/L (ref 0–44)
AST: 13 U/L — ABNORMAL LOW (ref 15–41)
Albumin: 2.6 g/dL — ABNORMAL LOW (ref 3.5–5.0)
Alkaline Phosphatase: 49 U/L (ref 38–126)
Anion gap: 7 (ref 5–15)
BUN: 26 mg/dL — ABNORMAL HIGH (ref 8–23)
CO2: 26 mmol/L (ref 22–32)
Calcium: 8 mg/dL — ABNORMAL LOW (ref 8.9–10.3)
Chloride: 102 mmol/L (ref 98–111)
Creatinine, Ser: 1.33 mg/dL — ABNORMAL HIGH (ref 0.61–1.24)
GFR calc Af Amer: >60 mL/min (ref >60)
GFR calc non Af Amer: 53 mL/min — ABNORMAL LOW (ref >60)
Glucose, Bld: 91 mg/dL (ref 70–99)
Potassium: 4.9 mmol/L (ref 3.5–5.1)
Sodium: 135 mmol/L (ref 135–145)
Total Bilirubin: 0.8 mg/dL (ref 0.3–1.2)
Total Protein: 5.3 g/dL — ABNORMAL LOW (ref 6.5–8.1)

## 2019-04-11 LAB — APTT: aPTT: >200 seconds (ref 24–36)

## 2019-04-11 LAB — CBC
HCT: 44.9 % (ref 39.0–52.0)
Hemoglobin: 13.8 g/dL (ref 13.0–17.0)
MCH: 30.4 pg (ref 26.0–34.0)
MCHC: 30.7 g/dL (ref 30.0–36.0)
MCV: 98.9 fL (ref 80.0–100.0)
Platelets: 225 10*3/uL (ref 150–400)
RBC: 4.54 MIL/uL (ref 4.22–5.81)
RDW: 15.9 % — ABNORMAL HIGH (ref 11.5–15.5)
WBC: 13.6 10*3/uL — ABNORMAL HIGH (ref 4.0–10.5)
nRBC: 0 % (ref 0.0–0.2)

## 2019-04-11 LAB — PHOSPHORUS: Phosphorus: 4.8 mg/dL — ABNORMAL HIGH (ref 2.5–4.6)

## 2019-04-11 LAB — HEPARIN LEVEL (UNFRACTIONATED)
Heparin Unfractionated: 0.95 IU/mL — ABNORMAL HIGH (ref 0.30–0.70)
Heparin Unfractionated: 0.29 [IU]/mL — ABNORMAL LOW (ref 0.30–0.70)
Heparin Unfractionated: 0.31 IU/mL (ref 0.30–0.70)

## 2019-04-11 LAB — MAGNESIUM: Magnesium: 2.5 mg/dL — ABNORMAL HIGH (ref 1.7–2.4)

## 2019-04-11 LAB — LACTIC ACID, PLASMA: Lactic Acid, Venous: 0.9 mmol/L (ref 0.5–1.9)

## 2019-04-11 LAB — MRSA PCR SCREENING: MRSA by PCR: NEGATIVE

## 2019-04-11 LAB — ECHOCARDIOGRAM COMPLETE
Height: 70.984 in
Weight: 3840 [oz_av]

## 2019-04-11 MED ORDER — ORAL CARE MOUTH RINSE
15.0000 mL | Freq: Two times a day (BID) | OROMUCOSAL | Status: DC
  Administered 2019-04-11: 16:00:00 15 mL via OROMUCOSAL

## 2019-04-11 MED ORDER — SODIUM CHLORIDE 0.9 % IV SOLN
2.0000 g | INTRAVENOUS | Status: DC
  Administered 2019-04-11 – 2019-04-12 (×2): 2 g via INTRAVENOUS
  Filled 2019-04-11: qty 2
  Filled 2019-04-11: qty 20

## 2019-04-11 MED ORDER — BUDESONIDE 0.5 MG/2ML IN SUSP
0.5000 mg | Freq: Two times a day (BID) | RESPIRATORY_TRACT | Status: DC
  Administered 2019-04-11 – 2019-04-12 (×3): 0.5 mg via RESPIRATORY_TRACT
  Filled 2019-04-11 (×3): qty 2

## 2019-04-11 MED ORDER — SODIUM CHLORIDE 0.9 % IV SOLN
500.0000 mg | INTRAVENOUS | Status: DC
  Administered 2019-04-11 – 2019-04-12 (×2): 500 mg via INTRAVENOUS
  Filled 2019-04-11 (×2): qty 500

## 2019-04-11 MED ORDER — CHLORHEXIDINE GLUCONATE 0.12 % MT SOLN
15.0000 mL | Freq: Two times a day (BID) | OROMUCOSAL | Status: DC
  Administered 2019-04-11: 13:00:00 15 mL via OROMUCOSAL
  Filled 2019-04-11: qty 15

## 2019-04-11 MED ORDER — LABETALOL HCL 5 MG/ML IV SOLN: 5.0000 mg | INTRAVENOUS | Status: DC | PRN

## 2019-04-11 MED ORDER — KCL IN DEXTROSE-NACL 20-5-0.9 MEQ/L-%-% IV SOLN
INTRAVENOUS | Status: DC
  Administered 2019-04-11 – 2019-04-12 (×2): via INTRAVENOUS
  Filled 2019-04-11 (×2): qty 1000

## 2019-04-11 MED ORDER — METHYLPREDNISOLONE SODIUM SUCC 125 MG IJ SOLR
60.0000 mg | Freq: Two times a day (BID) | INTRAMUSCULAR | Status: DC
  Administered 2019-04-11 – 2019-04-12 (×3): 60 mg via INTRAVENOUS
  Filled 2019-04-11 (×3): qty 2

## 2019-04-11 MED ORDER — NICOTINE 21 MG/24HR TD PT24: 21.0000 mg | MEDICATED_PATCH | Freq: Every day | TRANSDERMAL | Status: DC

## 2019-04-11 NOTE — Progress Notes (Addendum)
0952: Placed back on BiPAP, pt unable maintain goal SpO2 on 4LPM [place]. This RN has been at bedside for 5 minutes continually observing pt having significant apnea and SpO2 drops to 46%. Pt unable to maintain wakefulness, arouses but falls asleep instantly without physical stimulation. RASS -2. Pt grouchy when stimulated and demanding food, but will have garbled speech and fall back asleep.   This charge RN will report findings to bedside RN for monitoring and possible ABG?    1005: physician at bedside. VORBV stat ABG.  This RN notified RT for order completion.

## 2019-04-11 NOTE — Progress Notes (Signed)
ANTICOAGULATION CONSULT NOTE - Follow Up Consult  Pharmacy Consult for heparin Indication: h/o PE w/ suspected small subsegmental emboli  Labs: Recent Labs    04/10/19 1439 04/10/19 1521 04/10/19 1830 04/10/19 1840 04/11/19 0250 04/11/19 1124  HGB 15.1 16.3  --   --  13.8  --   HCT 51.1 48.0  --   --  44.9  --   PLT 295  --   --   --  225  --   APTT  --   --   --  34 >200*  --   HEPARINUNFRC  --   --   --  <0.10* 0.95* 0.29*  CREATININE 1.50*  --   --   --  1.33*  --   TROPONINIHS 5  --  <2  --   --   --     Assessment: 73yo male on heparin with initial dosing for PE and possible acute vs subacute PE, on apixaban PTA (LD 10/14- however baseline heparin level was not elevated).  Heparin level came back slightly subtherapeutic at 0.29, on 1250 units/hr. Hgb 13.8, plt 225. No s/sx of bleeding or infusion issues.   Goal of Therapy:  Heparin level 0.3-0.7 units/ml   Plan:  Increase heparin infusion to 1350 units/hr Order heparin level in 6 hours to confirm result Monitor daily HL, CBC, and for s/sx of bleeding   Antonietta Jewel, PharmD, BCCCP Clinical Pharmacist  Phone: [phone]  Please check AMION for all [place] phone numbers After 10:00 PM, call [place] [phone]  04/11/2019,1:02 PM

## 2019-04-11 NOTE — Progress Notes (Signed)
Palliative Medicine RN Note: Patient was admitted to [facility] [phone] when d/c from [place] earlier this month. He is being discharged from their service 10/16 due to care at non-contracted facility. They are open to readmitting him after d/c, if he agrees with hospice philosophy.  Marjie Skiff Tomicka Lover, RN, BSN, [facility] Palliative Medicine Team 04/11/2019 12:55 PM Office [phone]

## 2019-04-11 NOTE — Progress Notes (Signed)
ANTICOAGULATION CONSULT NOTE - Follow Up Consult  Pharmacy Consult for heparin Indication: h/o PE w/ suspected small subsegmental emboli  Labs: Recent Labs    04/10/19 1439 04/10/19 1521 04/10/19 1830 04/10/19 1840 04/11/19 0250  HGB 15.1 16.3  --   --  13.8  HCT 51.1 48.0  --   --  44.9  PLT 295  --   --   --  225  APTT  --   --   --  34 >200*  HEPARINUNFRC  --   --   --  <0.10* 0.95*  CREATININE 1.50*  --   --   --  1.33*  TROPONINIHS 5  --  <2  --   --     Assessment: 73yo male supratherapeutic on heparin with initial dosing for PE and possible acute vs subacute PE; no gtt issues or signs of bleeding per RN.  Goal of Therapy:  Heparin level 0.3-0.7 units/ml   Plan:  Will decrease heparin gtt by 3 units/kg/hr to 1250 units/hr and check level in 6 hours.    Wynona Neat, PharmD, BCPS  04/11/2019,4:53 AM

## 2019-04-11 NOTE — Progress Notes (Signed)
ANTICOAGULATION CONSULT NOTE - Follow Up Consult  Pharmacy Consult for heparin Indication: h/o PE w/ suspected small subsegmental emboli  Labs: Recent Labs    04/10/19 1439 04/10/19 1521 04/10/19 1830  04/10/19 1840 04/11/19 0250 04/11/19 1124 04/11/19 1943  HGB 15.1 16.3  --   --   --  13.8  --   --   HCT 51.1 48.0  --   --   --  44.9  --   --   PLT 295  --   --   --   --  225  --   --   APTT  --   --   --   --  34 >200*  --   --   HEPARINUNFRC  --   --   --    < > <0.10* 0.95* 0.29* 0.31  CREATININE 1.50*  --   --   --   --  1.33*  --   --   TROPONINIHS 5  --  <2  --   --   --   --   --    < > = values in this interval not displayed.    Assessment: 73yo male on heparin with initial dosing for PE and possible acute vs subacute PE, on apixaban PTA (LD 10/14- however baseline heparin level was not elevated).  Heparin level now therapeutic   Goal of Therapy:  Heparin level 0.3-0.7 units/ml   Plan:  Continue heparin infusion at 1350 units/hr Monitor daily HL, CBC, and for s/sx of bleeding   Thank you Anette Guarneri, PharmD Please check AMION for all [place] phone numbers After 10:00 PM, call [place] [phone]  04/11/2019,8:36 PM

## 2019-04-11 NOTE — Progress Notes (Signed)
  Echocardiogram 2D Echocardiogram has been performed.  Nathan Cantu 04/11/2019, 5:24 PM

## 2019-04-11 NOTE — Progress Notes (Signed)
CRITICAL VALUE ALERT  Critical Value:  PTT> 200  Date & Time Notied:  0450  Provider Notified: Pharmacist notified  Orders Received/Actions taken: Heparin dose changed

## 2019-04-11 NOTE — Progress Notes (Signed)
Came to room to eval pt and give breathing tx.  Found pt off bipap and on 4 lpm [place].  Pt was asleep but wakes up and answers questions currently.  Pt states he does not want to wear bipap currently.  No distress noted, VSS presently.

## 2019-04-11 NOTE — Progress Notes (Signed)
  Progress Note    04/11/2019 8:07 AM * No surgery found *  Subjective: States that he is feeling better this morning.  Currently on BiPAP  Vitals:   04/11/19 0300 04/11/19 0803  BP: 111/64   Pulse: 83 80  Resp: 12 18  Temp:    SpO2: 100% 100%    Physical Exam: Awake and alert On BiPAP Left foot erythema improving  CBC    Component Value Date/Time   WBC 13.6 (H) 04/11/2019 0250   RBC 4.54 04/11/2019 0250   HGB 13.8 04/11/2019 0250   HCT 44.9 04/11/2019 0250   PLT 225 04/11/2019 0250   MCV 98.9 04/11/2019 0250   MCH 30.4 04/11/2019 0250   MCHC 30.7 04/11/2019 0250   RDW 15.9 (H) 04/11/2019 0250   LYMPHSABS 1.5 04/10/2019 1439   MONOABS 1.1 (H) 04/10/2019 1439   EOSABS 0.3 04/10/2019 1439   BASOSABS 0.1 04/10/2019 1439    BMET    Component Value Date/Time   NA 135 04/11/2019 0250   K 4.9 04/11/2019 0250   CL 102 04/11/2019 0250   CO2 26 04/11/2019 0250   GLUCOSE 91 04/11/2019 0250   BUN 26 (H) 04/11/2019 0250   CREATININE 1.33 (H) 04/11/2019 0250   CALCIUM 8.0 (L) 04/11/2019 0250   GFRNONAA 53 (L) 04/11/2019 0250   GFRAA >60 04/11/2019 0250    INR No results found for: INR   Intake/Output Summary (Last 24 hours) at 04/11/2019 0807 Last data filed at 04/11/2019 0600 Gross per 24 hour  Intake 189.91 ml  Output -  Net 189.91 ml     Assessment/plan:  73 y.o. male is here with respiratory failure history of recent cerebellar stroke.  Also has recent history of PE on Eliquis now on heparin.  Has left great toe gangrenous changes with cellulitis that is improving with antibiotics.  Previously was seen by hospice.  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  At this time appears agreeable to at least consider left SFA stenting.  This would be palliative only he is high risk to lose the left great  toe.  Without revascularization he will be high risk for above-knee amputation on the left.  Certainly needs goals of care discussion.  I will tentatively plan for possible stenting of his left SFA on Monday.  If he elects to have no procedures we can cancel at that time.  At this time he appears to ill to consider repairing his thoracoabdominal aneurysm with either open or endovascular intervention.   Kamaron Deskins C. Donzetta Matters, MD [facility] [place] Office: [phone] Pager: [phone]  04/11/2019 8:07 AM

## 2019-04-11 NOTE — Progress Notes (Signed)
Pt asked to be taken off Bipap this morning. He I alert & oriented to time and place. Pt placed on a 4lpm [place]. Sp02 95%. RN noifed

## 2019-04-11 NOTE — Progress Notes (Signed)
Updated patient's sister, Prentiss Bells over the phone. She says he refused surgery at [facility] and went home with hospice. She is surprised he went to hospital. She would like to be informed before any procedure. She doesn't think he would like to have any surgery or aggressive procedure. She expresses appreciation about his care, particularly palliative care consult.

## 2019-04-11 NOTE — Progress Notes (Signed)
PROGRESS NOTE  Nathan Cantu ZOX:[id number] DOB: [birth year]   PCP: Judd Lien, PA-C  Patient is from: Home  DOA: 04/10/2019 LOS: 1  Brief Narrative / Interim history: 73 year old male with history of cerebellar stroke, tobacco abuse, COPD, possible OSA, tobacco abuse, diastolic CHF, severe PAD with known occlusion, AAA and PE on Eliquis brought to ED from podiatry office due to shortness of breath and left foot pain and admitted with acute on chronic respiratory failure with hypoxia and hypercarbia requiring BiPAP.  PCCM and vascular surgery consulted on admission.  Patient recently admitted to [facility] on 03/28/2019 for acute PE, acute occlusion of left SFA, respiratory failure requiring BiPAP and cellulitis of left foot.  Vascular surgery was consulted at that time however patient refused for  surgery.  He was seen by palliative care and was discharged on home hospice on 04/04/2019.  Subjective: Patient desaturated to 46% when taken off BiPAP to 4 LPM [place] this morning.  He also had apneic episodes.  Not able to stay awake.  He was put back on BiPAP.    He was a sleepy on BiPAP but arises to voice easily.  Oriented x4 - date.  He has no complaint during my exam other than feeling hungry.  He says he wants to eat. He denies chest pain and abdominal pain.   Objective: Vitals:   04/11/19 0803 04/11/19 0832 04/11/19 0953 04/11/19 1028  BP:    125/68  Pulse: 80  84 81  Resp: Temp:      TempSrc:      SpO2: 100% 95% (!) 87% 100%  Weight:      Height:        Intake/Output Summary (Last 24 hours) at 04/11/2019 1038 Last data filed at 04/11/2019 0600 Gross per 24 hour  Intake 189.91 ml  Output -  Net 189.91 ml   Filed Weights   04/10/19 1923  Weight: 108.9 kg    Examination:  GENERAL: Sleepy but arises to voice easily. HEENT: MMM.  Vision and hearing grossly intact.  NECK: Supple.  No apparent JVD.  RESP: On BiPAP.  Fair air movement  bilaterally. CVS:  RRR. Heart sounds normal.  No palpable DP pulses in left.  ABD/GI/GU: Bowel sounds present. Soft. Non tender.  MSK/EXT:  Moves extremities.  Pedal edema, left greater than right.  Erythema over dorsal aspect of his left foot. SKIN: no apparent skin lesion or wound NEURO: Sleepy but arises to voice easily.  Oriented x4 except to date.  No apparent focal neuro deficit.  Assessment & Plan: Acute respiratory failure with hypoxia and hypercarbia: Multifactorial including underlying COPD and possible OSA.  Patient was also on opiates.  COVID-19 negative.  CTA chest negative for PE but possible atelectasis or pneumonia in the lingula and lung bases.  Desaturated to 46% when attempted to wean off BiPAP to 4 L by [place] this morning.  Observed apnea.  ABG with acute on chronic respiratory acidosis.  -Continue BiPAP-remains n.p.o.  Patient is DNR/DNI. -Continue scheduled and as needed DuoNeb -Will add budesonide -Already on ceftriaxone for cellulitis. -Aspiration precautions. -We may have to consider systemic steroid.  Pulmonary embolism: Recently diagnosed at [facility] and discharged on Eliquis.  CTA chest here without central PE. -Hold Eliquis and continue IV heparin pending surgical intervention of left SFA occlusion.  Chronic aortic dissection/thoracoabdominal aneurysm: Noted on CTA with the largest one measuring 7.3 cm reported to be stable.   Severe PAD/left  SFA occlusion -Vascular surgery following-plan for palliative stenting on 10/19 -Optimize blood pressure and heart rate control.  Left lower extremity cellulitis: X-ray and CT did not reveal osseous involvement or fluid collection.  MRSA PCR negative.  Blood cultures negative so far. -Continue ceftriaxone -Discontinue vancomycin  AKI: Creatinine 1.5 (admit) > 1.3.  Baseline 0.9-1.  Likely ATN in the setting of hypoxia.  No other nephrotoxic meds.  Improving. -Continue trending -Avoid nephrotoxic meds.  Chronic diastolic CHF:  No echocardiogram in his chart or care everywhere.  -Closely monitor fluid status. -Obtain echocardiogram.  Essential hypertension: Normotensive off medications. -Continue monitoring -As needed labetalol  History of cerebellar CVA: Stable.  AKI: Improved. -Continue monitoring -Avoid nephrotoxic meds.  Hyperkalemia: Resolved.  Hypomagnesemia/hypophosphatemia: Resolved.  Tobacco use disorder -Nicotine patch-not sure if he is ready to quit.  Obesity: BMI 33.49. -Encourage lifestyle change  Opiate use: per narcotic database, filled 30 tablets of morphine ER 10 mg and 45 tablets of oxycodone 5 mg on 10/15 but not sure if he picked them up.  He also filled Roxanol and Ativan on 04/07/2019 likely through hospice. -COWS  Goal of care: Patient with multiple comorbidities now with respiratory failure.  He is now DNR/DNI.  Previously discharged home with home hospice.  At that time, he also declined oxygen because he did want to quit smoking. -We will consult palliative care.  DVT prophylaxis: On heparin for PE Code Status: DNR/DNI Family Communication: Patient and/or RN. Available if any question. Disposition Plan: Remains inpatient. Consultants: PCCM, VVS, palliative care  Procedures:  None  Microbiology summarized: COVID-19 negative.  Sch Meds:  Scheduled Meds: . chlorhexidine  15 mL Mouth Rinse BID  . ipratropium-albuterol  3 mL Nebulization Q6H  . mouth rinse  15 mL Mouth Rinse q12n4p   Continuous Infusions: . heparin 1,250 Units/hr (04/11/19 0454)  . vancomycin     PRN Meds:.ipratropium-albuterol, ondansetron **OR** ondansetron (ZOFRAN) IV  Antimicrobials: Anti-infectives (From admission, onward)   Start     Dose/Rate Route Frequency Ordered Stop   04/11/19 1600  vancomycin (VANCOCIN) 1,250 mg in sodium chloride 0.9 % 250 mL IVPB     1,250 mg 166.7 mL/hr over 90 Minutes Intravenous Every 24 hours 04/10/19 1653     04/10/19 1530  cefTRIAXone (ROCEPHIN) 2 g in  sodium chloride 0.9 % 100 mL IVPB     2 g 200 mL/hr over 30 Minutes Intravenous  Once 04/10/19 1527 04/10/19 1830   04/10/19 1515  vancomycin (VANCOCIN) 2,000 mg in sodium chloride 0.9 % 500 mL IVPB     2,000 mg 250 mL/hr over 120 Minutes Intravenous  Once 04/10/19 1511 04/10/19 1830       I have personally reviewed the following labs and images: CBC: Recent Labs  Lab 04/10/19 1439 04/10/19 1521 04/11/19 0250  WBC 15.7*  --  13.6*  NEUTROABS 12.4*  --   --   HGB 15.1 16.3 13.8  HCT 51.1 48.0 44.9  MCV 101.8*  --  98.9  PLT 295  --  225   BMP &GFR Recent Labs  Lab 04/10/19 1439 04/10/19 1521 04/10/19 1830 04/11/19 0250  NA 138 136  --  135  K 5.6* 5.6*  --  4.9  CL 101  --   --  102  CO2 24  --   --  26  GLUCOSE 89  --   --  91  BUN 31*  --   --  26*  CREATININE 1.50*  --   --  1.33*  CALCIUM 8.6*  --   --  8.0*  MG  --   --  0.6*  --   PHOS  --   --  1.4*  --    Estimated Creatinine Clearance: 63.9 mL/min (A) (by C-G formula based on SCr of 1.33 mg/dL (H)). Liver & Pancreas: Recent Labs  Lab 04/10/19 1439 04/11/19 0250  AST 15 13*  ALT 15 14  ALKPHOS 59 49  BILITOT 0.4 0.8  PROT 6.1* 5.3*  ALBUMIN 3.0* 2.6*   Recent Labs  Lab 04/10/19 1439  LIPASE 22   No results for input(s): AMMONIA in the last 168 hours. Diabetic: No results for input(s): HGBA1C in the last 72 hours. Recent Labs  Lab 04/10/19 1416  GLUCAP 77   Cardiac Enzymes: No results for input(s): CKTOTAL, CKMB, CKMBINDEX, TROPONINI in the last 168 hours. No results for input(s): PROBNP in the last 8760 hours. Coagulation Profile: No results for input(s): INR, PROTIME in the last 168 hours. Thyroid Function Tests: No results for input(s): TSH, T4TOTAL, FREET4, T3FREE, THYROIDAB in the last 72 hours. Lipid Profile: No results for input(s): CHOL, HDL, LDLCALC, TRIG, CHOLHDL, LDLDIRECT in the last 72 hours. Anemia Panel: No results for input(s): VITAMINB12, FOLATE, FERRITIN, TIBC,  IRON, RETICCTPCT in the last 72 hours. Urine analysis: No results found for: COLORURINE, APPEARANCEUR, LABSPEC, PHURINE, GLUCOSEU, HGBUR, BILIRUBINUR, KETONESUR, PROTEINUR, UROBILINOGEN, NITRITE, LEUKOCYTESUR Sepsis Labs: Invalid input(s): PROCALCITONIN, LACTICIDVEN  Microbiology: Recent Results (from the past 240 hour(s))  Blood culture (routine x 2)     Status: None (Preliminary result)   Collection Time: 04/10/19  3:00 PM   Specimen: BLOOD  Result Value Ref Range Status   Specimen Description BLOOD RIGHT ANTECUBITAL  Final   Special Requests   Final    BOTTLES DRAWN AEROBIC AND ANAEROBIC Blood Culture results may not be optimal due to an inadequate volume of blood received in culture bottles   Culture   Final    NO GROWTH < 24 HOURS Performed at [facility], [address] [place], [place] [address]    Report Status PENDING  Incomplete  SARS Coronavirus 2 by RT PCR (hospital order, performed in [facility] hospital lab) Nasopharyngeal Nasopharyngeal Swab     Status: None   Collection Time: 04/10/19  3:05 PM   Specimen: Nasopharyngeal Swab  Result Value Ref Range Status   SARS Coronavirus 2 NEGATIVE NEGATIVE Final    Comment: (NOTE) If result is NEGATIVE SARS-CoV-2 target nucleic acids are NOT DETECTED. The SARS-CoV-2 RNA is generally detectable in upper and lower  respiratory specimens during the acute phase of infection. The lowest  concentration of SARS-CoV-2 viral copies this assay can detect is 250  copies / mL. A negative result does not preclude SARS-CoV-2 infection  and should not be used as the sole basis for treatment or other  patient management decisions.  A negative result may occur with  improper specimen collection / handling, submission of specimen other  than nasopharyngeal swab, presence of viral mutation(s) within the  areas targeted by this assay, and inadequate number of viral copies  (<250 copies / mL). A negative result must be combined with  clinical  observations, patient history, and epidemiological information. If result is POSITIVE SARS-CoV-2 target nucleic acids are DETECTED. The SARS-CoV-2 RNA is generally detectable in upper and lower  respiratory specimens dur ing the acute phase of infection.  Positive  results are indicative of active infection with SARS-CoV-2.  Clinical  correlation  with patient history and other diagnostic information is  necessary to determine patient infection status.  Positive results do  not rule out bacterial infection or co-infection with other viruses. If result is PRESUMPTIVE POSTIVE SARS-CoV-2 nucleic acids MAY BE PRESENT.   A presumptive positive result was obtained on the submitted specimen  and confirmed on repeat testing.  While 2019 novel coronavirus  (SARS-CoV-2) nucleic acids may be present in the submitted sample  additional confirmatory testing may be necessary for epidemiological  and / or clinical management purposes  to differentiate between  SARS-CoV-2 and other Sarbecovirus currently known to infect humans.  If clinically indicated additional testing with an alternate test  methodology [phone]) is advised. The SARS-CoV-2 RNA is generally  detectable in upper and lower respiratory sp ecimens during the acute  phase of infection. The expected result is Negative. Fact Sheet for Patients:  BoilerBrush.com.cy Fact Sheet for Healthcare Providers: https://pope.com/ This test is not yet approved or cleared by the [place] FDA and has been authorized for detection and/or diagnosis of SARS-CoV-2 by FDA under an Emergency Use Authorization (EUA).  This EUA will remain in effect (meaning this test can be used) for the duration of the COVID-19 declaration under Section 564(b)(1) of the Act, 21 U.S.C. section 360bbb-3(b)(1), unless the authorization is terminated or revoked sooner. Performed at [facility], [address], [place], [place] [address]   Blood culture (routine x 2)     Status: None (Preliminary result)   Collection Time: 04/10/19  3:12 PM   Specimen: BLOOD  Result Value Ref Range Status   Specimen Description BLOOD LEFT ANTECUBITAL  Final   Special Requests   Final    BOTTLES DRAWN AEROBIC AND ANAEROBIC Blood Culture adequate volume   Culture   Final    NO GROWTH < 24 HOURS Performed at [facility], [address] [place], [place] [address]    Report Status PENDING  Incomplete  MRSA PCR Screening     Status: None   Collection Time: 04/11/19  2:39 AM   Specimen: Nasopharyngeal  Result Value Ref Range Status   MRSA by PCR NEGATIVE NEGATIVE Final    Comment:        The GeneXpert MRSA Assay (FDA approved for NASAL specimens only), is one component of a comprehensive MRSA colonization surveillance program. It is not intended to diagnose MRSA infection nor to guide or monitor treatment for MRSA infections. Performed at [facility], [address] [place], [place] [address]     Radiology Studies: Ct Angio Chest Pe W And/or Wo Contrast  Result Date: 04/11/2019 CLINICAL DATA:  Chest pain, complex, history of acute PE 03/28/2019 EXAM: CT ANGIOGRAPHY CHEST WITH CONTRAST TECHNIQUE: Multidetector CT imaging of the chest was performed using the standard protocol during bolus administration of intravenous contrast. Multiplanar CT image reconstructions and MIPs were obtained to evaluate the vascular anatomy. CONTRAST:  OMNIPAQUE IOHEXOL 350 MG/ML SOLN COMPARISON:  Chest x-ray 04/10/2019 FINDINGS: Cardiovascular: Limited opacification of the distal branch vessels within the lower lobes. Adequate opacification of the central pulmonary arteries which are without acute filling defect. Suspected small subsegmental filling defects within the right upper and lower lobes. No ascending aortic dissection. Coronary vascular calcification. Borderline heart size. No pericardial effusion.  Aneurysmal dilatation of the distal arch up to 4.1 cm. Irregular crescent shaped peripheral hypodensity within the distal arch with some displacement of wall calcification, possibly representing thrombosed dissection. This is of unknown chronicity.  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 No retroperitoneal hematoma or stranding to suggest leakage or rupture. Mediastinum/Nodes: Midline trachea. No thyroid mass. Borderline lymph nodes, for example right paratracheal lymph node measures 13 mm. Esophagus within normal limits. Lungs/Pleura: Mild emphysema. Trace pleural effusions. Peribronchial thickening in the lower lobes. Partial consolidations in the lingula and bilateral left greater than right lower lobes. No pneumothorax Upper Abdomen: Incompletely visualized abdominal aortic dissection. Musculoskeletal: No acute or suspicious osseous lesion. IMPRESSION: 1. No acute central embolus is seen. Suboptimal contrast opacification of the distal branch vessels which limits the exam. Suspected small subsegmental right upper and lower lobe emboli, uncertain if these are acute or subacute given history of known pulmonary emboli. No previous chest CT available for comparison. 2. Aneurysmal dilatation of the distal arch up to 4.1 cm with irregular peripheral thrombus, perhaps representing thrombosed dissection. This is of unknown chronicity. No hematoma or stranding to suggest leakage. Recommend semi-annual imaging followup by CTA or MRA and referral to cardiothoracic surgery if not already obtained. This recommendation follows 2010 ACCF/AHA/AATS/ACR/ASA/SCA/SCAI/SIR/STS/SVM Guidelines for the Diagnosis and Management of Patients With Thoracic Aortic Disease. Circulation. 2010; 121: Z308-M57. Aortic aneurysm NOS (ICD10-I71.9) 3. Chronic dissection of the distal thoracic aorta and visualized portions of the abdominal aorta with aneurysmal  dilatation of the thoracoabdominal aorta up to 7.4 cm, stable as compared with 03/27/2019. 4. Trace pleural effusions. Partial atelectasis or pneumonia in the lingula and lung bases. Aortic aneurysm NOS (ICD10-I71.9). Aortic Atherosclerosis (ICD10-I70.0) and Emphysema (ICD10-J43.9). Critical Value/emergent results were called by telephone at the time of interpretation on 04/11/2019 at 3:17 am to providerXenia Blount, who verbally acknowledged these results. Electronically Signed   By: Jasmine Pang M.D.   On: 04/11/2019 03:17   Ct Angio Aortobifemoral W And/or Wo Contrast  Result Date: 04/11/2019 CLINICAL DATA:  [facility]/28/2019 for acute occlusion of the left SFA EXAM: CT ANGIOGRAPHY OF ABDOMINAL AORTA WITH ILIOFEMORAL RUNOFF TECHNIQUE: Multidetector CT imaging of the abdomen, pelvis and lower extremities was performed using the standard protocol during bolus administration of intravenous contrast. Multiplanar CT image reconstructions and MIPs were obtained to evaluate the vascular anatomy. 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 The left-sided opacified, lenticular lumen seen more anteriorly is the true lumen when viewed on comparison exams. Distal infrarenal abdominal aortic caliber measures up to 53 mm, stable from comparison when measured at a similar level. IMA: True lumen gives rise to the IMA without proximal stenosis or occlusion. Suboptimal opacification given configuration of the aortic dissection. No gross abnormality is seen. RIGHT Lower Extremity Inflow: Extension of the dissection in right common iliac terminating at the level of the internal/external bifurcation. Maximal diameter measures up to 3.7 cm with the iliac artery supplied by the false lumen, a maximal  luminal diameter of 2.1 cm. Internal and external iliac arteries are diseased but without evidence of dissection flap, stenosis or occlusion. Outflow: Patent right superficial femoral artery and profundus femoris arteries. Mild disease throughout the vessel. Runoff: There is aneurysm of the right popliteal artery to a maximum luminal diameter of 3.9 cm which is unchanged from comparison. There is abundant mural plaque and thrombus. Vessel returns to a normal caliber at the bifurcation of the anterior tibial  artery and tibioperoneal trunk. Patent but decreased three-vessel runoff to the right lower extremity. Posterior tibial and dorsalis pedis remain patent. LEFT Lower Extremity Inflow: Extension of the aortic aneurysm into the left common iliac with a maximum vessel diameter of 3 cm. Aneurysm extends to the level of the bifurcation with the false lumen supplying the internal iliac artery and the true lumen appearing to supply the left external iliac. Opacified portions of the internal and external iliac arteries are unremarkable. Outflow: Patency of the common femoral and profundus femora. The superficial femoral artery abruptly occludes at the level of the mid femur had a similar level to the comparison exam. The vessel remains occluded throughout the popliteal artery segment. The popliteal artery itself appears somewhat expanded with a maximal diameter of 18 mm. Runoff: There is reconstituted three-vessel distal runoff in the calf supplied by collaterals. (5/252). Flow within the anterior tibial artery tapers at the level of the ankle. Similar to comparison with non opacification of the dorsalis pedis. The posterior tibial artery remains patent to the level of the pedal arch. Veins: No obvious venous abnormality within the limitations of this arterial phase study. Review of the MIP images confirms the above findings. NON-VASCULAR Urinary Tract: Mild bladder wall distension. No acute abnormality is seen. Distal  ureters are largely decompressed. Bowel: No small bowel dilatation or wall thickening. A normal appendix is visualized. No visible colonic thickening or dilatation. Scattered colonic diverticula without focal pericolonic inflammation to suggest diverticulitis. Lymphatic: No suspicious or enlarged lymph nodes in the included lymphatic chains. Reproductive: Coarse eccentric calcification of the prostate. No concerning abnormalities of the prostate or seminal vesicles. Small bilateral hydroceles. Other: No abdominopelvic free fluid or free gas. No bowel containing hernias. Circumferential body wall edema is noted. Musculoskeletal: There is circumferential soft tissue edema and skin thickening of both lower extremities most pronounced at the level of the cavus and feet. No soft tissue gas or foreign body is evident. Metallic side plate and screw construct is seen along the right lateral tibial plateau, no evidence of hardware fracture or loosening. Included portions of the pelvis are free of acute abnormality with moderate to severe degenerative changes in both hips. Additional degenerative features are noted in lower lumbar spine. There is levocurvature of the spine. Sclerotic endplate changes at the lower lumbar levels are unchanged from prior. IMPRESSION: VASCULAR: 1. 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 The true lumen is the more diminutive, lentiform lumen seen a left anterolaterally. Stable diameter of the distal aortic aneurysm up to 5.3 cm. 2. Extension into the right common iliac with aneurysm measuring up to 3.7 cm in diameter. False lumen appears to supply both the internal and external iliac arteries. 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 Normal 3 vessel runoff. 3. Extension of the aortic aneurysm into the left common iliac with a maximum vessel diameter of 3 cm, true lumen supplies the external iliac  with the false lumen supplies the internal iliac. 4. Occlusion of the left superficial femoral artery at the level of the mid femur extending through the left popliteal artery with reconstituted three-vessel distal runoff in the calf supplied by collaterals. 5. Reconstituted three-vessel runoff of the left lower extremity but with distal tapering of the distal left anterior tibial artery with non opacification of the dorsalis pedis. NON-VASCULAR 1. Bilateral lower extremity edema and cellulitis, right greater than left. No discrete  collection or abscess is seen. 2. Colonic diverticulosis without evidence of acute diverticulitis. 3. Post ORIF of the right lateral tibial plateau. 4. 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 Electronically Signed   By: Kreg Shropshire M.D.   On: 04/11/2019 02:48   Dg Chest Portable 1 View  Result Date: 04/10/2019 CLINICAL DATA:  73 year old male with shortness of breath. EXAM: PORTABLE CHEST 1 VIEW COMPARISON:  Chest radiograph dated 03/27/2019 FINDINGS: There is eventration of the left hemidiaphragm similar to prior radiograph. There is diffuse interstitial and vascular prominence which may represent a degree of congestion and edema. Pneumonia is not excluded. Clinical correlation is recommended. There is no large pleural effusion. No pneumothorax. Stable cardiomegaly. No acute osseous pathology. IMPRESSION: Cardiomegaly with findings of possible CHF. Pneumonia is not excluded. Clinical correlation is recommended. Electronically Signed   By: Elgie Collard M.D.   On: 04/10/2019 15:08   Dg Foot 2 Views Left  Result Date: 04/10/2019 CLINICAL DATA:  Foot pain. EXAM: LEFT FOOT - 2 VIEW COMPARISON:  None. FINDINGS: There is no evidence of fracture or dislocation. There is no evidence of arthropathy or other focal bone abnormality. Soft tissues are unremarkable. IMPRESSION: Negative. Electronically Signed   By: Lupita Raider M.D.   On: 04/10/2019 15:07    45 minutes with more than 50%  spent in reviewing records, counseling patient and coordinating care.   T.  [facility]  If 7PM-7AM, please contact night-coverage [email or web address] Password [email or web address] 04/11/2019, 10:38 AM

## 2019-04-11 NOTE — Progress Notes (Signed)
NAME:  Nathan Cantu, MRN:  [id number], DOB:  12/03/1945, LOS: 1 ADMISSION DATE:  04/10/2019, CONSULTATION DATE:  10/15 REFERRING MD:  Dr. Clayborne DanaMesner, CHIEF COMPLAINT:  SOB  Brief History   [id number] year old male with multiple medical problems admitted 10/15 with complaints or SOB and leg pain. Recent Dx PE as well. Pulmonary consultation requested for evaluation of dyspnea.   History of present illness   73 year old with multiple medical problems. [facility] was [facility] where he underwent treatment for new arterial occlusion of left SFA with ischemic toes, mixed respiratory failure requiring BiPAP, PE, and COPD exacerbation. Vascular surgery evaluated him at that time and initially recommended conservative management with anti-coagulation. They then determined he would likely require toe amputation,which the patient refused at that time. He was also found to have PE at that time. He was discharged on eliquis. He was ultimately seen by palliative care and was discharged on home hospice services 10/9. Then 10/15 [facility] office with complaints of left leg pain and dyspnea. There was concern for worsening of his known vascular injury and the appearance of infected foot. Vascuar surgery considering a trip to the OR for SFA stent and possible toe amputation. Became somnolent in ED. ABG showed hypercarbia. BiPAP pending COVID test results.   PCCM consulted for known PE history and pre op clearance.   Past Medical History   has a past medical history of (HFpEF) heart failure with preserved ejection fraction (HCC), Cerebellar stroke (HCC), Chronic hypercapnic respiratory failure (HCC), Chronic hypoxemic respiratory failure (HCC), Femoral artery occlusion (HCC), Pulmonary embolism (HCC), Thoracoabdominal aneurysm (HCC), and Tobacco abuse.  Significant Hospital Events   [facility]9 Admit 10/15  Consults:  Vascular PCCM  Procedures:      Significant Diagnostic Tests:  CTA chest 10/15 > chronic LLL atelectasis and diaphragm dysfunction CT abdomen pelvis 10/15 > chronic dissection, abrupt cutoff R SFA, similar to prior  Micro Data:  BCx2 10/15 > Urine 10/15 >  Antimicrobials:  Vancomycin 10/15 > Rocephin 10/15 >  Interim history/subjective:  Had some retention issues this AM.  Now wants food.  Denies SOB/wheezing.  Objective   Blood pressure 125/68, pulse 81, temperature 97.9 F (36.6 C), temperature source Oral, resp. rate 15, height 5' 10.98" (1.803 m), weight 108.9 kg, SpO2 100 %.    FiO2 (%):  [40 %-50 %] 40 %   Intake/Output Summary (Last 24 hours) at 04/11/2019 1131 Last data filed at 04/11/2019 0600 Gross per 24 hour  Intake 189.91 ml  Output -  Net 189.91 ml   Filed Weights   04/10/19 1923  Weight: 108.9 kg    Examination: GEN: obese chronically ill appearing man in NAD HEENT: BIPAP in place with good seal CV: Heart sounds borderline tachycardic, ext warm PULM: Shallow inspiratory effort, no wheezing GI: Protuberant, soft EXT: 1+ edema on L, trace on R, left foot erythmatous with ulcer and early eschar of left toe NEURO: moves all 4 ext to command PSYCH: oriented but lacks insight SKIN: chronic vascular insiffuciency changes   Resolved Hospital Problem list   NA  Assessment & Plan:  73 yo M w/ [facility] with pulmonary embolism, cellulitis, SFA occlusion, thoracoabdominal aneurysm. Refused intervention at [facility] and discharged with palliative care. [facility] and [facility] and SOB. Sounds like [facility] was a bit of a  mess due to patient cooperation with treatment plan.  Chronic hypercapnic respiratory failure L hemidiaphragm elevation from prior issues with severe pancreatitis Reported hx of COPD, no PFTs on file Active smoker -previous L hydropneumothorax thought to be related to bad pancreatitis back in 2018.   This was left alone; what I suspect is this was actually a pseudocyst and the pancreatitis destroyed L diaphragm. 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  We will be happy to follow him postop as needed, may need to stay intubated postop.  Best practice:  Diet: cardiac diet trial Pain/Anxiety/Delirium protocol (if indicated): na  VAP protocol (if indicated): na DVT prophylaxis: heparin gtt Glucose control: monitor  Mobility: bedrest  Code Status: DNR  Family Communication: per primary Disposition: SDU  Labs   CBC: Recent Labs  Lab 04/10/19 1439 04/10/19 1521 04/11/19 0250  WBC 15.7*  --  13.6*  NEUTROABS 12.4*  --   --   HGB 15.1 16.3 13.8  HCT 51.1 48.0 44.9  MCV 101.8*  --  98.9  PLT 295  --  225    Basic Metabolic Panel: Recent Labs  Lab 04/10/19 1439 04/10/19 1521 04/10/19 1830 04/11/19 0250  NA 138 136  --  135  K 5.6* 5.6*  --  4.9  CL 101  --   --  102  CO2 24  --   --  26  GLUCOSE 89  --   --  91  BUN 31*  --   --  26*  CREATININE 1.50*  --   --  1.33*  CALCIUM 8.6*  --   --  8.0*  MG  --   --  0.6* 2.5*  PHOS  --    --  1.4* 4.8*   GFR: Estimated Creatinine Clearance: 63.9 mL/min (A) (by C-G formula based on SCr of 1.33 mg/dL (H)). Recent Labs  Lab 04/10/19 1439 04/10/19 1441 04/11/19 0250  WBC 15.7*  --  13.6*  LATICACIDVEN  --  1.8 0.9    Liver Function Tests: Recent Labs  Lab 04/10/19 1439 04/11/19 0250  AST 15 13*  ALT 15 14  ALKPHOS 59 49  BILITOT 0.4 0.8  PROT 6.1* 5.3*  ALBUMIN 3.0* 2.6*   Recent Labs  Lab 04/10/19 1439  LIPASE 22   No results for input(s): AMMONIA in the last 168 hours.  ABG    Component Value Date/Time   PHART 7.271 (L) 04/11/2019 1020   PCO2ART 66.6 (HH) 04/11/2019 1020   PO2ART 75.0 (L) 04/11/2019 1020   HCO3 29.8 (H) 04/11/2019 1020   TCO2 31 04/10/2019 1521   ACIDBASEDEF 1.0  04/10/2019 1521   O2SAT 94.3 04/11/2019 1020     Coagulation Profile: No results for input(s): INR, PROTIME in the last 168 hours.  Cardiac Enzymes: No results for input(s): CKTOTAL, CKMB, CKMBINDEX, TROPONINI in the last 168 hours.  HbA1C: No results found for: HGBA1C  CBG: Recent Labs  Lab 04/10/19 1416  GLUCAP 77    Review of Systems:    Positive Symptoms in bold:  Constitutional fevers, chills, weight loss, fatigue, anorexia, malaise  Eyes decreased vision, double vision, eye irritation  Ears, Nose, Mouth, Throat sore throat, trouble swallowing, sinus congestion  Cardiovascular chest pain, paroxysmal nocturnal dyspnea, lower ext edema, palpitations   Respiratory SOB, cough, DOE, hemoptysis, wheezing  Gastrointestinal nausea, vomiting, diarrhea  Genitourinary burning with urination, trouble urinating  Musculoskeletal joint aches, joint swelling, back pain  Integumentary  rashes, skin lesions  Neurological focal weakness, focal numbness, trouble speaking, headaches  Psychiatric depression, anxiety, confusion  Endocrine polyuria, polydipsia, cold intolerance, heat intolerance  Hematologic abnormal bruising, abnormal bleeding, unexplained nose bleeds   Allergic/Immunologic recurrent infections, hives, swollen lymph nodes     Past Medical History  He,  has a past medical history of (HFpEF) heart failure with preserved ejection fraction (HCC), Cerebellar stroke (HCC), Chronic hypercapnic respiratory failure ([place]), Chronic hypoxemic respiratory failure ([place]), Femoral artery occlusion ([place]), Pulmonary embolism ([place]), Thoracoabdominal aneurysm ([place]), and Tobacco abuse.   Surgical History   Tonsillectomy  Social History  25 pack year smoker, still smoking Denies etoh  Family History   His Family history is unknown by patient.   Allergies Not on File   Home Medications  Prior to Admission medications   Not on File

## 2019-04-12 DIAGNOSIS — Z515 Encounter for palliative care: Secondary | ICD-10-CM

## 2019-04-12 DIAGNOSIS — L0390 Cellulitis, unspecified: Secondary | ICD-10-CM

## 2019-04-12 LAB — BLOOD GAS, ARTERIAL
Acid-Base Excess: 4.4 mmol/L — ABNORMAL HIGH (ref 0.0–2.0)
Bicarbonate: 30.4 mmol/L — ABNORMAL HIGH (ref 20.0–28.0)
Drawn by: 414221
O2 Content: 12 L/min
O2 Saturation: 84.9 %
Patient temperature: 98.6
pCO2 arterial: 64 mmHg — ABNORMAL HIGH (ref 32.0–48.0)
pH, Arterial: 7.298 — ABNORMAL LOW (ref 7.350–7.450)
pO2, Arterial: 52.7 mmHg — ABNORMAL LOW (ref 83.0–108.0)

## 2019-04-12 LAB — BASIC METABOLIC PANEL
Anion gap: 8 (ref 5–15)
BUN: 25 mg/dL — ABNORMAL HIGH (ref 8–23)
CO2: 26 mmol/L (ref 22–32)
Calcium: 8.6 mg/dL — ABNORMAL LOW (ref 8.9–10.3)
Chloride: 103 mmol/L (ref 98–111)
Creatinine, Ser: 1.14 mg/dL (ref 0.61–1.24)
GFR calc Af Amer: >60 mL/min (ref >60)
GFR calc non Af Amer: >60 mL/min (ref >60)
Glucose, Bld: 144 mg/dL — ABNORMAL HIGH (ref 70–99)
Potassium: 5.1 mmol/L (ref 3.5–5.1)
Sodium: 137 mmol/L (ref 135–145)

## 2019-04-12 LAB — CBC
HCT: 46.1 % (ref 39.0–52.0)
Hemoglobin: 14.5 g/dL (ref 13.0–17.0)
MCH: 30.7 pg (ref 26.0–34.0)
MCHC: 31.5 g/dL (ref 30.0–36.0)
MCV: 97.7 fL (ref 80.0–100.0)
Platelets: 240 10*3/uL (ref 150–400)
RBC: 4.72 MIL/uL (ref 4.22–5.81)
RDW: 15.2 % (ref 11.5–15.5)
WBC: 15.1 10*3/uL — ABNORMAL HIGH (ref 4.0–10.5)
nRBC: 0 % (ref 0.0–0.2)

## 2019-04-12 LAB — HEPARIN LEVEL (UNFRACTIONATED)
Heparin Unfractionated: 0.27 IU/mL — ABNORMAL LOW (ref 0.30–0.70)
Heparin Unfractionated: 0.38 IU/mL (ref 0.30–0.70)

## 2019-04-12 LAB — MAGNESIUM: Magnesium: 2.1 mg/dL (ref 1.7–2.4)

## 2019-04-12 LAB — PHOSPHORUS: Phosphorus: 3.5 mg/dL (ref 2.5–4.6)

## 2019-04-12 MED ORDER — MORPHINE SULFATE (CONCENTRATE) 20 MG/ML PO SOLN: 10.0000 mg | ORAL | 0 refills | Status: AC | PRN

## 2019-04-12 MED ORDER — PREDNISONE 50 MG PO TABS: 50.0000 mg | ORAL_TABLET | Freq: Every day | ORAL | 0 refills | Status: AC

## 2019-04-12 MED ORDER — LORAZEPAM 0.5 MG PO TABS: 0.5000 mg | ORAL_TABLET | ORAL | 0 refills | Status: AC | PRN

## 2019-04-12 MED ORDER — TRAMADOL HCL 50 MG PO TABS
50.0000 mg | ORAL_TABLET | Freq: Two times a day (BID) | ORAL | Status: DC | PRN
  Administered 2019-04-12: 12:00:00 50 mg via ORAL
  Filled 2019-04-12: qty 1

## 2019-04-12 MED ORDER — ACETAMINOPHEN 500 MG PO TABS
1000.0000 mg | ORAL_TABLET | Freq: Three times a day (TID) | ORAL | Status: DC | PRN
  Administered 2019-04-12: 1000 mg via ORAL
  Filled 2019-04-12: qty 2

## 2019-04-12 MED ORDER — MORPHINE SULFATE (PF) 2 MG/ML IV SOLN: 1.0000 mg | Freq: Once | INTRAVENOUS | Status: DC

## 2019-04-12 MED ORDER — DOXYCYCLINE HYCLATE 100 MG PO TABS: 100.0000 mg | ORAL_TABLET | Freq: Two times a day (BID) | ORAL | 0 refills | Status: AC

## 2019-04-12 MED ORDER — GLYCOPYRROLATE 2 MG PO TABS: 2.0000 mg | ORAL_TABLET | Freq: Three times a day (TID) | ORAL | 0 refills | Status: AC | PRN

## 2019-04-12 MED ORDER — OXYCODONE HCL 5 MG PO TABS
5.0000 mg | ORAL_TABLET | Freq: Once | ORAL | Status: AC
  Administered 2019-04-12: 5 mg via ORAL
  Filled 2019-04-12: qty 1

## 2019-04-12 NOTE — Discharge Summary (Signed)
Physician Discharge Summary  Karell Tukes XBM:[id number] DOB: [birth year] DOA: 04/10/2019  PCP: Judd Lien, PA-C  Admit date: 04/10/2019 Discharge date: 04/12/2019  Admitted From: Home Disposition: Home with home hospice  Discharge Condition: Guarded prognosis CODE STATUS: DNR/DNI  Hospital Course: 73 year old male with history of cerebellar stroke, tobacco abuse, COPD, possible OSA, tobacco abuse, diastolic CHF, severe PAD with known occlusion, AAA and PE on Eliquis brought to ED from podiatry office due to shortness of breath and left foot pain and admitted with acute on chronic respiratory failure with hypoxia and hypercarbia requiring BiPAP.  PCCM and vascular surgery consulted on admission.  Patient recently admitted to [facility] on 03/28/2019 for acute PE, acute occlusion of left SFA, respiratory failure requiring BiPAP and cellulitis of left foot. Vascular surgery was consulted at that time however patient refused for surgery. He was seen by palliative care and was discharged on home hospice on 04/04/2019.  Patient was admitted for acute on chronic respiratory failure with hypoxia and hypercarbia, left leg cellulitis, left SFA occlusion and multiple stable thoracoabdominal aneurysms.  Initially started on BiPAP and treated for COPD exacerbation.  He later refused wearing BiPAP and transition to high flow nasal cannula.  Maintained appropriate saturation on 12 L by high flow nasal cannula.   On the day of discharge, patient requested to be discharged to go home despite requiring high level of oxygen.  He expresses his wish to die at home.  He is awake, alert and oriented x4.  Has a good insight into his condition.  Understands about the imminent death.  This was also discussed with patient's sister over the phone who is in agreement with his decision given his comorbidity.  CSW consulted and contacted his home hospice.  Patient discharged on his home  breathing treatments, Roxanol, Ativan and glycopyrrolate.  See individual problems below for more on hospital course.  Discharge Diagnoses:  Acute on chronic respiratory failure with hypoxia and hypercarbia: Multifactorial including AECOPD and possible OSA.  Patient was also on opiates.  COVID-19 negative.  CTA chest negative for PE but possible atelectasis or pneumonia in the lingula and lung bases.  ABG with hypoxia and hypercarbia.  Continues to refuse BiPAP.  Currently on high flow nasal cannula.  Wish to go home and die at home.  Patient sister in agreement.  Has oxygen at home. -Discharged on prednisone, doxycycline, home oxygen and breathing treatments -Morphine, Ativan and glycopyrrolate as needed  Pulmonary embolism: Recently diagnosed at [facility] and discharged on Eliquis.  CTA chest here without central PE. -Can continue home Eliquis  Chronic aortic dissection/thoracoabdominal aneurysm:    Severe PAD/left SFA occlusion -Refused surgical intervention.  Patient sister aware.  Left lower extremity cellulitis: X-ray and CT did not reveal osseous involvement or fluid collection.  MRSA PCR negative.  Blood cultures negative so far.  Swelling and erythema improving. -Continue ceftriaxone 10/15> 10/16 -Discharged on doxycycline for 10 more days.  AKI: Creatinine 1.5 (admit) > 1.3>1.14.  Baseline 0.9-1.  Likely ATN in the setting of hypoxia.  No other nephrotoxic meds.  Improving.   Chronic diastolic CHF: Echo with EF of 60 to 65%, but no other significant finding.  BNP normal.  Appears euvolemic.  Essential hypertension: Normotensive off medications.  History of cerebellar CVA: Stable.  Hyperkalemia: Resolved.  Hypomagnesemia/hypophosphatemia: Resolved.  Tobacco use disorder -Nicotine patch-not sure if he is ready to quit.  Obesity: BMI 33.49. -Encourage lifestyle change  Opiate use: per narcotic database, filled 30  tablets of morphine ER 10 mg and 45 tablets of  oxycodone 5 mg on 10/15 but not sure if he picked them up.  He also filled Roxanol and Ativan on 04/07/2019 likely through hospice. -COWS  Goal of care: Patient with multiple comorbidities now with respiratory failure.  He is now DNR/DNI.  Previously discharged home with home hospice.  At that time, he also declined oxygen because he did want to quit smoking.  Patient sister is interested in palliative care consult. -Palliative care consulted.  Discharge Instructions  Discharge Instructions    Diet general   Complete by: As directed    Increase activity slowly   Complete by: As directed      Allergies as of 04/12/2019   Not on File     Medication List    TAKE these medications   Albuterol Sulfate 2.5 MG/0.5ML Nebu Inhale into the lungs.   arformoterol 15 MCG/2ML Nebu Commonly known as: BROVANA Take 15 mcg by nebulization.   aspirin 81 MG chewable tablet   budesonide 0.5 MG/2ML nebulizer solution Commonly known as: PULMICORT Take 0.5 mg by nebulization.   Eliquis 5 MG Tabs tablet Generic drug: apixaban Take 5 mg by mouth 2 (two) times daily.   lidocaine 5 % ointment Commonly known as: XYLOCAINE Apply 1 application topically as needed.   morphine 30 MG tablet Commonly known as: MSIR 30 mg.   NORVASC PO Take by mouth.   oxyCODONE 5 MG immediate release tablet Commonly known as: Oxy IR/ROXICODONE Take 5 mg by mouth.   polyethylene glycol 17 g packet Commonly known as: MIRALAX / GLYCOLAX 17 g.        Consultations:  PCCM  Vascular surgery  Palliative care  Procedures/Studies: 2D Echo:   1. Left ventricular ejection fraction, by visual estimation, is 60 to 65%. The left ventricle has normal function. Normal left ventricular size. There is no left ventricular hypertrophy.  2. Elevated mean left atrial pressure.  3. Left ventricular diastolic Doppler parameters are consistent with impaired relaxation pattern of LV diastolic filling.  4. Global right  ventricle has normal systolic function.The right ventricular size is normal. No increase in right ventricular wall thickness.  5. Left atrial size was mildly dilated.  6. Right atrial size was normal.  7. Moderate mitral annular calcification.  8. The mitral valve is normal in structure. No evidence of mitral valve regurgitation.  9. The tricuspid valve is normal in structure. Tricuspid valve regurgitation is mild. 10. The aortic valve is tricuspid Aortic valve regurgitation was not visualized by color flow Doppler. Mild aortic valve stenosis. 11. The pulmonic valve was grossly normal. Pulmonic valve regurgitation is not visualized by color flow Doppler. 12. Moderately elevated pulmonary artery systolic pressure. 13. The inferior vena cava is normal in size with greater than 50% respiratory variability, suggesting right atrial pressure of 3 mmHg.  Ct Angio Chest Pe W And/or Wo Contrast  Result Date: 04/11/2019 CLINICAL DATA:  Chest pain, complex, history of acute PE 03/28/2019 EXAM: CT ANGIOGRAPHY CHEST WITH CONTRAST TECHNIQUE: Multidetector CT imaging of the chest was performed using the standard protocol during bolus administration of intravenous contrast. Multiplanar CT image reconstructions and MIPs were obtained to evaluate the vascular anatomy. CONTRAST:  OMNIPAQUE IOHEXOL 350 MG/ML SOLN COMPARISON:  Chest x-ray 04/10/2019 FINDINGS: Cardiovascular: Limited opacification of the distal branch vessels within the lower lobes. Adequate opacification of the central pulmonary arteries which are without acute filling defect. Suspected small subsegmental filling defects within the right  upper and lower lobes. No ascending aortic dissection. Coronary vascular calcification. Borderline heart size. No pericardial effusion. Aneurysmal dilatation of the distal arch up to 4.1 cm. Irregular crescent shaped peripheral hypodensity within the distal arch with some displacement of wall calcification, possibly  representing thrombosed dissection. This is of unknown chronicity. 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 No retroperitoneal hematoma or stranding to suggest leakage or rupture. Mediastinum/Nodes: Midline trachea. No thyroid mass. Borderline lymph nodes, for example right paratracheal lymph node measures 13 mm. Esophagus within normal limits. Lungs/Pleura: Mild emphysema. Trace pleural effusions. Peribronchial thickening in the lower lobes. Partial consolidations in the lingula and bilateral left greater than right lower lobes. No pneumothorax Upper Abdomen: Incompletely visualized abdominal aortic dissection. Musculoskeletal: No acute or suspicious osseous lesion. IMPRESSION: 1. No acute central embolus is seen. Suboptimal contrast opacification of the distal branch vessels which limits the exam. Suspected small subsegmental right upper and lower lobe emboli, uncertain if these are acute or subacute given history of known pulmonary emboli. No previous chest CT available for comparison. 2. Aneurysmal dilatation of the distal arch up to 4.1 cm with irregular peripheral thrombus, perhaps representing thrombosed dissection. This is of unknown chronicity. No hematoma or stranding to suggest leakage. Recommend semi-annual imaging followup by CTA or MRA and referral to cardiothoracic surgery if not already obtained. This recommendation follows 2010 ACCF/AHA/AATS/ACR/ASA/SCA/SCAI/SIR/STS/SVM Guidelines for the Diagnosis and Management of Patients With Thoracic Aortic Disease. Circulation. 2010; 121: J478-G95: E266-e36. Aortic aneurysm NOS (ICD10-I71.9) 3. 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 4. Trace pleural effusions. Partial atelectasis or pneumonia in the lingula and lung bases.  Aortic aneurysm NOS (ICD10-I71.9). Aortic Atherosclerosis (ICD10-I70.0) and Emphysema (ICD10-J43.9). Critical Value/emergent results were called by telephone at the time of interpretation on 04/11/2019 at 3:17 am to providerXenia Blount, who verbally acknowledged these results. Electronically Signed   By: Jasmine PangKim  Fujinaga M.D.   On: 04/11/2019 03:17   Ct Angio Aortobifemoral W And/or Wo Contrast  Result Date: 04/11/2019 CLINICAL DATA:  [facility] for acute occlusion of the left SFA EXAM: CT ANGIOGRAPHY OF ABDOMINAL AORTA WITH ILIOFEMORAL RUNOFF TECHNIQUE: Multidetector CT imaging of the abdomen, pelvis and lower extremities was performed using the standard protocol during bolus administration of intravenous contrast. Multiplanar CT image reconstructions and MIPs were obtained to evaluate the vascular anatomy. 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 The left-sided opacified, lenticular lumen seen more anteriorly is the true lumen when viewed on comparison exams. Distal infrarenal abdominal aortic caliber measures up to 53 mm, stable from comparison when measured at a similar level. IMA: True lumen gives rise to the IMA without proximal stenosis or occlusion. Suboptimal opacification given configuration of the aortic dissection. No gross abnormality is seen. RIGHT Lower Extremity Inflow: Extension of the dissection in right common iliac terminating at the level of the internal/external bifurcation. Maximal diameter measures up to 3.7 cm with the iliac artery supplied by the false lumen, a maximal luminal diameter of 2.1 cm. Internal and external iliac arteries are diseased but without evidence of dissection flap, stenosis or occlusion. Outflow: Patent right superficial femoral  artery and profundus femoris arteries. Mild disease throughout the vessel. Runoff: There is aneurysm of the right popliteal artery to a maximum luminal diameter of 3.9 cm which is unchanged from comparison. There is abundant mural plaque and thrombus. Vessel returns to a normal caliber at the bifurcation of the anterior tibial artery and tibioperoneal trunk. Patent but decreased three-vessel runoff to the right lower extremity. Posterior tibial and dorsalis pedis remain patent. LEFT Lower Extremity Inflow: Extension of the aortic aneurysm into the left common iliac with a maximum vessel diameter of 3 cm. Aneurysm extends to the level of the bifurcation with the false lumen supplying the internal iliac artery and the true lumen appearing to supply the left external iliac. Opacified portions of the internal and external iliac arteries are unremarkable. Outflow: Patency of the common femoral and profundus femora. The superficial femoral artery abruptly occludes at the level of the mid femur had a similar level to the comparison exam. The vessel remains occluded throughout the popliteal artery segment. The popliteal artery itself appears somewhat expanded with a maximal diameter of 18 mm. Runoff: There is reconstituted three-vessel distal runoff in the calf supplied by collaterals. (5/252). Flow within the anterior tibial artery tapers at the level of the ankle. Similar to comparison with non opacification of the dorsalis pedis. The posterior tibial artery remains patent to the level of the pedal arch. Veins: No obvious venous abnormality within the limitations of this arterial phase study. Review of the MIP images confirms the above findings. NON-VASCULAR Urinary Tract: Mild bladder wall distension. No acute abnormality is seen. Distal ureters are largely decompressed. Bowel: No small bowel dilatation or wall thickening. A normal appendix is visualized. No visible colonic thickening or dilatation. Scattered colonic  diverticula without focal pericolonic inflammation to suggest diverticulitis. Lymphatic: No suspicious or enlarged lymph nodes in the included lymphatic chains. Reproductive: Coarse eccentric calcification of the prostate. No concerning abnormalities of the prostate or seminal vesicles. Small bilateral hydroceles. Other: No abdominopelvic free fluid or free gas. No bowel containing hernias. Circumferential body wall edema is noted. Musculoskeletal: There is circumferential soft tissue edema and skin thickening of both lower extremities most pronounced at the level of the cavus and feet. No soft tissue gas or foreign body is evident. Metallic side plate and screw construct is seen along the right lateral tibial plateau, no evidence of hardware fracture or loosening. Included portions of the pelvis are free of acute abnormality with moderate to severe degenerative changes in both hips. Additional degenerative features are noted in lower lumbar spine. There is levocurvature of the spine. Sclerotic endplate changes at the lower lumbar levels are unchanged from prior. IMPRESSION: VASCULAR: 1. 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 The true lumen is the more diminutive, lentiform lumen seen a left anterolaterally. Stable diameter of the distal aortic aneurysm up to 5.3 cm. 2. Extension into the right common iliac with aneurysm measuring up to 3.7 cm in diameter. False lumen appears to supply both the internal and external iliac arteries. 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 Normal 3 vessel runoff. 3. Extension of the aortic aneurysm into the left common iliac with a maximum vessel diameter of 3 cm, true lumen supplies the external iliac with the false lumen supplies the internal iliac. 4. Occlusion of the left superficial femoral artery at the level of the mid femur extending through the left popliteal artery with  reconstituted three-vessel distal runoff in the calf supplied by collaterals. 5. Reconstituted three-vessel runoff of the left lower extremity but with distal tapering of the distal left anterior tibial artery with non opacification of the dorsalis pedis. NON-VASCULAR 1. Bilateral lower extremity edema and cellulitis, right greater than left. No discrete collection or abscess is seen. 2. Colonic diverticulosis without evidence of acute diverticulitis. 3. Post ORIF of the right lateral tibial plateau. 4. 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 Electronically Signed   By: Kreg Shropshire M.D.   On: 04/11/2019 02:48   Dg Chest Portable 1 View  Result Date: 04/10/2019 CLINICAL DATA:  73 year old male with shortness of breath. EXAM: PORTABLE CHEST 1 VIEW COMPARISON:  Chest radiograph dated 03/27/2019 FINDINGS: There is eventration of the left hemidiaphragm similar to prior radiograph. There is diffuse interstitial and vascular prominence which may represent a degree of congestion and edema. Pneumonia is not excluded. Clinical correlation is recommended. There is no large pleural effusion. No pneumothorax. Stable cardiomegaly. No acute osseous pathology. IMPRESSION: Cardiomegaly with findings of possible CHF. Pneumonia is not excluded. Clinical correlation is recommended. Electronically Signed   By: Elgie Collard M.D.   On: 04/10/2019 15:08   Dg Foot 2 Views Left  Result Date: 04/10/2019 CLINICAL DATA:  Foot pain. EXAM: LEFT FOOT - 2 VIEW COMPARISON:  None. FINDINGS: There is no evidence of fracture or dislocation. There is no evidence of arthropathy or other focal bone abnormality. Soft tissues are unremarkable. IMPRESSION: Negative. Electronically Signed   By: Lupita Raider M.D.   On: 04/10/2019 15:07     Subjective: Patient requested discharge stating that he would like to go home and die at home.    Discharge Exam: Vitals:   04/12/19 1300 04/12/19 1337  BP:  111/75  Pulse: (!) 101 90  Resp: 20 (!) 25    Temp:    SpO2: 91% 92%   GENERAL: No acute distress.  Sitting in bed. HEENT: MMM.  Vision and hearing grossly intact.  NECK: Supple.  No apparent JVD.  RESP:  No IWOB.  Diminished aeration bilaterally. CVS:  RRR. Heart sounds normal.  ABD/GI/GU: Bowel sounds present. Soft. Non tender.  MSK/EXT:  Moves extremities.  Trace edema bilaterally.  Erythema over left foot SKIN: Erythema over left foot. NEURO: Awake, alert and oriented appropriately.  No gross deficit.  PSYCH: Calm. Normal affect.   The results of significant diagnostics from this hospitalization (including imaging, microbiology, ancillary and laboratory) are listed below for reference.     Microbiology: Recent Results (from the past 240 hour(s))  Blood culture (routine x 2)     Status: None (Preliminary result)   Collection Time: 04/10/19  3:00 PM   Specimen: BLOOD  Result Value Ref Range Status   Specimen Description BLOOD RIGHT ANTECUBITAL  Final   Special Requests   Final    BOTTLES DRAWN AEROBIC AND ANAEROBIC Blood Culture results may not be optimal due to an inadequate volume of blood received in culture bottles   Culture   Final    NO GROWTH 2 DAYS Performed at [facility], [address], [place], [place] [address]    Report Status PENDING  Incomplete  SARS Coronavirus 2 by RT PCR (hospital order, performed in [facility] hospital lab) Nasopharyngeal Nasopharyngeal Swab     Status: None   Collection Time: 04/10/19  3:05 PM   Specimen: Nasopharyngeal Swab  Result Value Ref Range Status   SARS Coronavirus 2 NEGATIVE NEGATIVE Final  Comment: (NOTE) If result is NEGATIVE SARS-CoV-2 target nucleic acids are NOT DETECTED. The SARS-CoV-2 RNA is generally detectable in upper and lower  respiratory specimens during the acute phase of infection. The lowest  concentration of SARS-CoV-2 viral copies this assay can detect is 250  copies / mL. A negative result does not preclude SARS-CoV-2 infection  and should  not be used as the sole basis for treatment or other  patient management decisions.  A negative result may occur with  improper specimen collection / handling, submission of specimen other  than nasopharyngeal swab, presence of viral mutation(s) within the  areas targeted by this assay, and inadequate number of viral copies  (<250 copies / mL). A negative result must be combined with clinical  observations, patient history, and epidemiological information. If result is POSITIVE SARS-CoV-2 target nucleic acids are DETECTED. The SARS-CoV-2 RNA is generally detectable in upper and lower  respiratory specimens dur ing the acute phase of infection.  Positive  results are indicative of active infection with SARS-CoV-2.  Clinical  correlation with patient history and other diagnostic information is  necessary to determine patient infection status.  Positive results do  not rule out bacterial infection or co-infection with other viruses. If result is PRESUMPTIVE POSTIVE SARS-CoV-2 nucleic acids MAY BE PRESENT.   A presumptive positive result was obtained on the submitted specimen  and confirmed on repeat testing.  While 2019 novel coronavirus  (SARS-CoV-2) nucleic acids may be present in the submitted sample  additional confirmatory testing may be necessary for epidemiological  and / or clinical management purposes  to differentiate between  SARS-CoV-2 and other Sarbecovirus currently known to infect humans.  If clinically indicated additional testing with an alternate test  methodology [phone]) is advised. The SARS-CoV-2 RNA is generally  detectable in upper and lower respiratory sp ecimens during the acute  phase of infection. The expected result is Negative. Fact Sheet for Patients:  StrictlyIdeas.no Fact Sheet for Healthcare Providers: BankingDealers.co.za This test is not yet approved or cleared by the [place] FDA and has been  authorized for detection and/or diagnosis of SARS-CoV-2 by FDA under an Emergency Use Authorization (EUA).  This EUA will remain in effect (meaning this test can be used) for the duration of the COVID-19 declaration under Section 564(b)(1) of the Act, 21 U.S.C. section 360bbb-3(b)(1), unless the authorization is terminated or revoked sooner. Performed at [facility], [place] [address] [place], [place] [address]   Blood culture (routine x 2)     Status: None (Preliminary result)   Collection Time: 04/10/19  3:12 PM   Specimen: BLOOD  Result Value Ref Range Status   Specimen Description BLOOD LEFT ANTECUBITAL  Final   Special Requests   Final    BOTTLES DRAWN AEROBIC AND ANAEROBIC Blood Culture adequate volume   Culture   Final    NO GROWTH 2 DAYS Performed at [facility], [place] [address] [place], [place] [address]    Report Status PENDING  Incomplete  MRSA PCR Screening     Status: None   Collection Time: 04/11/19  2:39 AM   Specimen: Nasopharyngeal  Result Value Ref Range Status   MRSA by PCR NEGATIVE NEGATIVE Final    Comment:        The GeneXpert MRSA Assay (FDA approved for NASAL specimens only), is one component of a comprehensive MRSA colonization surveillance program. It is not intended to diagnose MRSA infection nor to guide or monitor treatment for MRSA  infections. Performed at [facility], [address], [place], [place] [address]      Labs: BNP (last 3 results) Recent Labs    04/10/19 1442  BNP 60.2   Basic Metabolic Panel: Recent Labs  Lab 04/10/19 1439 04/10/19 1521 04/10/19 1830 04/11/19 0250 04/12/19 0249  NA 138 136  --  135 137  K 5.6* 5.6*  --  4.9 5.1  CL 101  --   --  102 103  CO2 24  --   --  26 26  GLUCOSE 89  --   --  91 144*  BUN 31*  --   --  26* 25*  CREATININE 1.50*  --   --  1.33* 1.14  CALCIUM 8.6*  --   --  8.0* 8.6*  MG  --   --  0.6* 2.5* 2.1  PHOS  --   --  1.4* 4.8* 3.5   Liver Function  Tests: Recent Labs  Lab 04/10/19 1439 04/11/19 0250  AST 15 13*  ALT 15 14  ALKPHOS 59 49  BILITOT 0.4 0.8  PROT 6.1* 5.3*  ALBUMIN 3.0* 2.6*   Recent Labs  Lab 04/10/19 1439  LIPASE 22   No results for input(s): AMMONIA in the last 168 hours. CBC: Recent Labs  Lab 04/10/19 1439 04/10/19 1521 04/11/19 0250 04/12/19 0249  WBC 15.7*  --  13.6* 15.1*  NEUTROABS 12.4*  --   --   --   HGB 15.1 16.3 13.8 14.5  HCT 51.1 48.0 44.9 46.1  MCV 101.8*  --  98.9 97.7  PLT 295  --  225 240   Cardiac Enzymes: No results for input(s): CKTOTAL, CKMB, CKMBINDEX, TROPONINI in the last 168 hours. BNP: Invalid input(s): POCBNP CBG: Recent Labs  Lab 04/10/19 1416  GLUCAP 77   D-Dimer No results for input(s): DDIMER in the last 72 hours. Hgb A1c No results for input(s): HGBA1C in the last 72 hours. Lipid Profile No results for input(s): CHOL, HDL, LDLCALC, TRIG, CHOLHDL, LDLDIRECT in the last 72 hours. Thyroid function studies No results for input(s): TSH, T4TOTAL, T3FREE, THYROIDAB in the last 72 hours.  Invalid input(s): FREET3 Anemia work up No results for input(s): VITAMINB12, FOLATE, FERRITIN, TIBC, IRON, RETICCTPCT in the last 72 hours. Urinalysis No results found for: COLORURINE, APPEARANCEUR, LABSPEC, PHURINE, GLUCOSEU, HGBUR, BILIRUBINUR, KETONESUR, PROTEINUR, UROBILINOGEN, NITRITE, LEUKOCYTESUR Sepsis Labs Invalid input(s): PROCALCITONIN,  WBC,  LACTICIDVEN   Time coordinating discharge: 45 minutes  SIGNED:  Almon Hercules, MD  [facility] 04/12/2019, 2:06 PM  If 7PM-7AM, please contact night-coverage [email or web address] Password [email or web address]

## 2019-04-12 NOTE — Progress Notes (Addendum)
Patient refuses to wear BiPap at this time despite being re-educated on the devices purpose and its need in this situation.  Patient placed on humidified high flow nasal cannula by RT. Will continue to re-educate and monitor patient closely.

## 2019-04-12 NOTE — TOC Transition Note (Addendum)
Transition of Care [facility]) - CM/SW Discharge Note   Patient Details  Name: Nels Munn MRN: [id number] Date of Birth: [birth year]  Transition of Care [facility]) CM/SW Contact:  Zenon Mayo, RN Phone Number: 04/12/2019, 2:05 PM   Clinical Narrative:    Patient will dc home today and resume services with [facility].  NCM spoke with patient sister, and informed her of this information she states sounds good.  Patient states yes he needs ambulance transport.  NCM contacted Hoyle Sauer with [facility] and she states they will have someone there by 5 pm.  I will scheduled transport fo 3 pm.  He is on oxygen at home also. Patient wants to continue with [facility].   Final next level of care: Home w Hospice Care Barriers to Discharge: No Barriers Identified   Patient Goals and CMS Choice Patient states their goals for this hospitalization and ongoing recovery are:: home with hospice CMS Medicare.gov Compare Post Acute Care list provided to:: Patient Choice offered to / list presented to : Adult Children, Patient(continue with [facility])  Discharge Placement                       Discharge Plan and Services                DME Arranged: (NA)         HH Arranged: RN HH Agency: ([place]) Date [place]: 04/12/19 Time [place]: [phone] Representative spoke with at [place]: [place] ([place]) Interventions     Readmission Risk Interventions No flowsheet data found.

## 2019-04-12 NOTE — Progress Notes (Signed)
ANTICOAGULATION CONSULT NOTE - Follow Up Consult  Pharmacy Consult for heparin Indication: h/o PE w/ suspected small subsegmental emboli  Labs: Recent Labs    04/10/19 1439 04/10/19 1521 04/10/19 1830  04/10/19 1840 04/11/19 0250 04/11/19 1124 04/11/19 1943 04/12/19 0249  HGB 15.1 16.3  --   --   --  13.8  --   --  14.5  HCT 51.1 48.0  --   --   --  44.9  --   --  46.1  PLT 295  --   --   --   --  225  --   --  240  APTT  --   --   --   --  34 >200*  --   --   --   HEPARINUNFRC  --   --   --    < > <0.10* 0.95* 0.29* 0.31 0.27*  CREATININE 1.50*  --   --   --   --  1.33*  --   --   --   TROPONINIHS 5  --  <2  --   --   --   --   --   --    < > = values in this interval not displayed.    Assessment: 73yo male now subtherapeutic on heparin after one level at low end of goal; no gtt issues or signs of bleeding per RN.  Goal of Therapy:  Heparin level 0.3-0.7 units/ml   Plan:  Will increase heparin gtt by 1-2 units/kg/hr to 1500 units/hr and check level in 6 hours.    Wynona Neat, PharmD, BCPS  04/12/2019,3:57 AM

## 2019-04-12 NOTE — Progress Notes (Signed)
PROGRESS NOTE  Nathan Cantu MHD:[id number] DOB: 01/31/1946   PCP: Judd Lien, PA-C  Patient is from: Home  DOA: 04/10/2019 LOS: 2  Brief Narrative / Interim history: 73 year old male with history of cerebellar stroke, tobacco abuse, COPD, possible OSA, tobacco abuse, diastolic CHF, severe PAD with known occlusion, AAA and PE on Eliquis brought to ED from podiatry office due to shortness of breath and left foot pain and admitted with acute on chronic respiratory failure with hypoxia and hypercarbia requiring BiPAP.  PCCM and vascular surgery consulted on admission.  Patient recently admitted to [facility] on 03/28/2019 for acute PE, acute occlusion of left SFA, respiratory failure requiring BiPAP and cellulitis of left foot.  Vascular surgery was consulted at that time however patient refused for  surgery.  He was seen by palliative care and was discharged on home hospice on 04/04/2019.    Subjective: No major events overnight of this morning.  Refused BiPAP last night.  Currently on 10 L by HFNC saturating in low 90s.  Denies chest pain.  Does not appear to be in distress.  Reports some pain in his feet, left greater than right.  He says "get me better or let me die".  Oriented x4 except the bed.  Objective: Vitals:   04/12/19 0400 04/12/19 0500 04/12/19 0800 04/12/19 0827  BP: 113/80  123/66 123/66  Pulse: 100  95 97  Resp: 18  20 (!) 25  Temp:   98.2 F (36.8 C)   TempSrc: Oral  Oral   SpO2: 94%  98% 98%  Weight:  117.2 kg    Height:        Intake/Output Summary (Last 24 hours) at 04/12/2019 1113 Last data filed at 04/12/2019 1000 Gross per 24 hour  Intake 2120.56 ml  Output 1625 ml  Net 495.56 ml   Filed Weights   04/10/19 1923 04/12/19 0500  Weight: 108.9 kg 117.2 kg    Examination:  GENERAL: No acute distress.  Sitting in bed. HEENT: MMM.  Vision and hearing grossly intact.  NECK: Supple.  No apparent JVD.  RESP:  No IWOB.  Diminished  aeration bilaterally. CVS:  RRR. Heart sounds normal.  ABD/GI/GU: Bowel sounds present. Soft. Non tender.  MSK/EXT:  Moves extremities.  Trace edema bilaterally.  Erythema over left foot SKIN: Erythema over left foot. NEURO: Awake, alert and oriented appropriately.  No gross deficit.  PSYCH: Calm. Normal affect.   Assessment & Plan: Acute on chronic respiratory failure with hypoxia and hypercarbia: Multifactorial including AECOPD and possible OSA.  Patient was also on opiates.  COVID-19 negative.  CTA chest negative for PE but possible atelectasis or pneumonia in the lingula and lung bases.  ABG with hypoxia and hypercarbia.  Continues to refuse BiPAP.  Currently on high flow nasal cannula. -Appreciate PCCM guidance -Continue budesonide, DuoNeb and Solu-Medrol.e -Already on ceftriaxone for cellulitis. -Aspiration precautions.  Pulmonary embolism: Recently diagnosed at [facility] and discharged on Eliquis.  CTA chest here without central PE. -Hold Eliquis and continue IV heparin pending surgical intervention of left SFA occlusion.  Chronic aortic dissection/thoracoabdominal aneurysm: Noted on CTA with the largest one measuring 7.3 cm reported to be stable.   Severe PAD/left SFA occlusion -Vascular surgery following-plan for palliative stenting on 10/19 -Optimize blood pressure and heart rate control.  Left lower extremity cellulitis: X-ray and CT did not reveal osseous involvement or fluid collection.  MRSA PCR negative.  Blood cultures negative so far.  Swelling and erythema improving. -Continue  ceftriaxone 10/15>> -Vancomycin 10/15-10/16  AKI: Creatinine 1.5 (admit) > 1.3>1.14.  Baseline 0.9-1.  Likely ATN in the setting of hypoxia.  No other nephrotoxic meds.  Improving. -Continue trending -Avoid nephrotoxic meds.  Chronic diastolic CHF: Echo with EF of 60 to 65%, but no other significant finding.  BNP normal.  Appears euvolemic. -Discontinue IV fluid-taking p.o. now. -Closely monitor  fluid status.  Essential hypertension: Normotensive off medications. -Continue monitoring -As needed labetalol  History of cerebellar CVA: Stable.  Hyperkalemia: Resolved.  Hypomagnesemia/hypophosphatemia: Resolved.  Tobacco use disorder -Nicotine patch-not sure if he is ready to quit.  Obesity: BMI 33.49. -Encourage lifestyle change  Opiate use: per narcotic database, filled 30 tablets of morphine ER 10 mg and 45 tablets of oxycodone 5 mg on 10/15 but not sure if he picked them up.  He also filled Roxanol and Ativan on 04/07/2019 likely through hospice. -COWS  Goal of care: Patient with multiple comorbidities now with respiratory failure.  He is now DNR/DNI.  Previously discharged home with home hospice.  At that time, he also declined oxygen because he did want to quit smoking.  Patient sister is interested in palliative care consult. -Palliative care consulted.  DVT prophylaxis: On heparin for PE Code Status: DNR/DNI Family Communication: Updated patient's sister on 10/16. Disposition Plan: Remains inpatient. Consultants: PCCM, VVS, palliative care  Procedures:  None  Microbiology summarized: COVID-19 negative.  Sch Meds:  Scheduled Meds: . budesonide (PULMICORT) nebulizer solution  0.5 mg Nebulization BID  . chlorhexidine  15 mL Mouth Rinse BID  . ipratropium-albuterol  3 mL Nebulization Q6H  . mouth rinse  15 mL Mouth Rinse q12n4p  . methylPREDNISolone (SOLU-MEDROL) injection  60 mg Intravenous Q12H  .  morphine injection  1 mg Intravenous Once  . nicotine  21 mg Transdermal Daily   Continuous Infusions: . azithromycin Stopped (04/11/19 1446)  . cefTRIAXone (ROCEPHIN)  IV Stopped (04/11/19 1414)  . dextrose 5 % and 0.9 % NaCl with KCl 20 mEq/L 75 mL/hr at 04/12/19 0900  . heparin 1,500 Units/hr (04/12/19 0900)   PRN Meds:.ipratropium-albuterol, labetalol, ondansetron **OR** ondansetron (ZOFRAN) IV  Antimicrobials: Anti-infectives (From admission, onward)    Start     Dose/Rate Route Frequency Ordered Stop   04/11/19 1600  vancomycin (VANCOCIN) 1,250 mg in sodium chloride 0.9 % 250 mL IVPB  Status:  Discontinued     1,250 mg 166.7 mL/hr over 90 Minutes Intravenous Every 24 hours 04/10/19 1653 04/11/19 1125   04/11/19 1200  cefTRIAXone (ROCEPHIN) 2 g in sodium chloride 0.9 % 100 mL IVPB     2 g 200 mL/hr over 30 Minutes Intravenous Every 24 hours 04/11/19 1125     04/11/19 1200  azithromycin (ZITHROMAX) 500 mg in sodium chloride 0.9 % 250 mL IVPB     500 mg 250 mL/hr over 60 Minutes Intravenous Every 24 hours 04/11/19 1140 04/14/19 1159   04/10/19 1530  cefTRIAXone (ROCEPHIN) 2 g in sodium chloride 0.9 % 100 mL IVPB     2 g 200 mL/hr over 30 Minutes Intravenous  Once 04/10/19 1527 04/10/19 1830   04/10/19 1515  vancomycin (VANCOCIN) 2,000 mg in sodium chloride 0.9 % 500 mL IVPB     2,000 mg 250 mL/hr over 120 Minutes Intravenous  Once 04/10/19 1511 04/10/19 1830       I have personally reviewed the following labs and images: CBC: Recent Labs  Lab 04/10/19 1439 04/10/19 1521 04/11/19 0250 04/12/19 0249  WBC 15.7*  --  13.6* 15.1*  NEUTROABS 12.4*  --   --   --   HGB 15.1 16.3 13.8 14.5  HCT 51.1 48.0 44.9 46.1  MCV 101.8*  --  98.9 97.7  PLT 295  --  225 240   BMP &GFR Recent Labs  Lab 04/10/19 1439 04/10/19 1521 04/10/19 1830 04/11/19 0250 04/12/19 0249  NA 138 136  --  135 137  K 5.6* 5.6*  --  4.9 5.1  CL 101  --   --  102 103  CO2 24  --   --  26 26  GLUCOSE 89  --   --  91 144*  BUN 31*  --   --  26* 25*  CREATININE 1.50*  --   --  1.33* 1.14  CALCIUM 8.6*  --   --  8.0* 8.6*  MG  --   --  0.6* 2.5* 2.1  PHOS  --   --  1.4* 4.8* 3.5   Estimated Creatinine Clearance: 77.4 mL/min (by C-G formula based on SCr of 1.14 mg/dL). Liver & Pancreas: Recent Labs  Lab 04/10/19 1439 04/11/19 0250  AST 15 13*  ALT 15 14  ALKPHOS 59 49  BILITOT 0.4 0.8  PROT 6.1* 5.3*  ALBUMIN 3.0* 2.6*   Recent Labs  Lab 04/10/19  1439  LIPASE 22   No results for input(s): AMMONIA in the last 168 hours. Diabetic: No results for input(s): HGBA1C in the last 72 hours. Recent Labs  Lab 04/10/19 1416  GLUCAP 77   Cardiac Enzymes: No results for input(s): CKTOTAL, CKMB, CKMBINDEX, TROPONINI in the last 168 hours. No results for input(s): PROBNP in the last 8760 hours. Coagulation Profile: No results for input(s): INR, PROTIME in the last 168 hours. Thyroid Function Tests: No results for input(s): TSH, T4TOTAL, FREET4, T3FREE, THYROIDAB in the last 72 hours. Lipid Profile: No results for input(s): CHOL, HDL, LDLCALC, TRIG, CHOLHDL, LDLDIRECT in the last 72 hours. Anemia Panel: No results for input(s): VITAMINB12, FOLATE, FERRITIN, TIBC, IRON, RETICCTPCT in the last 72 hours. Urine analysis: No results found for: COLORURINE, APPEARANCEUR, LABSPEC, PHURINE, GLUCOSEU, HGBUR, BILIRUBINUR, KETONESUR, PROTEINUR, UROBILINOGEN, NITRITE, LEUKOCYTESUR Sepsis Labs: Invalid input(s): PROCALCITONIN, [place]  Microbiology: Recent Results (from the past 240 hour(s))  Blood culture (routine x 2)     Status: None (Preliminary result)   Collection Time: 04/10/19  3:00 PM   Specimen: BLOOD  Result Value Ref Range Status   Specimen Description BLOOD RIGHT ANTECUBITAL  Final   Special Requests   Final    BOTTLES DRAWN AEROBIC AND ANAEROBIC Blood Culture results may not be optimal due to an inadequate volume of blood received in culture bottles   Culture   Final    NO GROWTH 2 DAYS Performed at [facility] [address] [place], [place] [address]    Report Status PENDING  Incomplete  SARS Coronavirus 2 by RT PCR (hospital order, performed in [facility] hospital lab) Nasopharyngeal Nasopharyngeal Swab     Status: None   Collection Time: 04/10/19  3:05 PM   Specimen: Nasopharyngeal Swab  Result Value Ref Range Status   SARS Coronavirus 2 NEGATIVE NEGATIVE Final    Comment: (NOTE) If result is NEGATIVE  SARS-CoV-2 target nucleic acids are NOT DETECTED. The SARS-CoV-2 RNA is generally detectable in upper and lower  respiratory specimens during the acute phase of infection. The lowest  concentration of SARS-CoV-2 viral copies this assay can detect is 250  copies / mL. A negative result does not  preclude SARS-CoV-2 infection  and should not be used as the sole basis for treatment or other  patient management decisions.  A negative result may occur with  improper specimen collection / handling, submission of specimen other  than nasopharyngeal swab, presence of viral mutation(s) within the  areas targeted by this assay, and inadequate number of viral copies  (<250 copies / mL). A negative result must be combined with clinical  observations, patient history, and epidemiological information. If result is POSITIVE SARS-CoV-2 target nucleic acids are DETECTED. The SARS-CoV-2 RNA is generally detectable in upper and lower  respiratory specimens dur ing the acute phase of infection.  Positive  results are indicative of active infection with SARS-CoV-2.  Clinical  correlation with patient history and other diagnostic information is  necessary to determine patient infection status.  Positive results do  not rule out bacterial infection or co-infection with other viruses. If result is PRESUMPTIVE POSTIVE SARS-CoV-2 nucleic acids MAY BE PRESENT.   A presumptive positive result was obtained on the submitted specimen  and confirmed on repeat testing.  While 2019 novel coronavirus  (SARS-CoV-2) nucleic acids may be present in the submitted sample  additional confirmatory testing may be necessary for epidemiological  and / or clinical management purposes  to differentiate between  SARS-CoV-2 and other Sarbecovirus currently known to infect humans.  If clinically indicated additional testing with an alternate test  methodology [phone]) is advised. The SARS-CoV-2 RNA is generally  detectable in upper  and lower respiratory sp ecimens during the acute  phase of infection. The expected result is Negative. Fact Sheet for Patients:  BoilerBrush.com.cy Fact Sheet for Healthcare Providers: https://pope.com/ This test is not yet approved or cleared by the [place] FDA and has been authorized for detection and/or diagnosis of SARS-CoV-2 by FDA under an Emergency Use Authorization (EUA).  This EUA will remain in effect (meaning this test can be used) for the duration of the COVID-19 declaration under Section 564(b)(1) of the Act, 21 U.S.C. section 360bbb-3(b)(1), unless the authorization is terminated or revoked sooner. Performed at [facility], [address] [place], [place] [address]   Blood culture (routine x 2)     Status: None (Preliminary result)   Collection Time: 04/10/19  3:12 PM   Specimen: BLOOD  Result Value Ref Range Status   Specimen Description BLOOD LEFT ANTECUBITAL  Final   Special Requests   Final    BOTTLES DRAWN AEROBIC AND ANAEROBIC Blood Culture adequate volume   Culture   Final    NO GROWTH 2 DAYS Performed at [facility], [address], [place], [place] [address]    Report Status PENDING  Incomplete  MRSA PCR Screening     Status: None   Collection Time: 04/11/19  2:39 AM   Specimen: Nasopharyngeal  Result Value Ref Range Status   MRSA by PCR NEGATIVE NEGATIVE Final    Comment:        The GeneXpert MRSA Assay (FDA approved for NASAL specimens only), is one component of a comprehensive MRSA colonization surveillance program. It is not intended to diagnose MRSA infection nor to guide or monitor treatment for MRSA infections. Performed at [facility], [address] [place], [place] [address]     Radiology Studies: No results found.  35 minutes with more than 50% spent in reviewing records, counseling patient and coordinating care.  Shantel Wesely T. Willi Borowiak [facility]  If  7PM-7AM, please contact night-coverage [email or web address] Password [facility] 04/12/2019,  11:13 AM

## 2019-04-12 NOTE — Consult Note (Signed)
Consultation Note Date: 04/12/2019   Patient Name: Nathan Cantu  DOB: 1946-01-11  MRN: [id number]  Age / Sex: 73 y.o., male  PCP: Judd Lien, PA-C Referring Physician: Almon Hercules, MD  Reason for Consultation: Establishing goals of care  HPI/Patient Profile: 73 y.o. male  with past medical history of COPD with continued tobacco use, severe PVD with SFA occlusion, CVA, D-CHF, and recent PE (hospitalized at [facility] 10/2 for acute PE) who was admitted on 04/10/2019 with hypercapnic respiratory failure requiring BiPAP and non-resolving cellulitis.   Clinical Assessment and Goals of Care:  I have reviewed medical records including EPIC notes, labs and imaging, received report from the care team, assessed the patient and then spoke with his sister on the phone to discuss diagnosis prognosis, GOC, EOL wishes, disposition and options.  I introduced Palliative Medicine as specialized medical care for people living with serious illness. It focuses on providing relief from the symptoms and stress of a serious illness. The goal is to improve quality of life for both the patient and the family.  The patient's sister Pieter Partridge is out of state.  She just returned home after staying here for 1 month.  She immediately commented I'm so glad you're involved.  He doesn't want to be at the hospital.  He doesn't want any interventions.  He's on Hospice.  Please help him get home.  I visited with the patient at bedside he requested pain medication and oral antibiotics.  He stated "I'm slowly dying and I wish it would hurry up."  I ordered an Oxy IR for him and discussed oral antibiotics with the attending physician.  The patient is being discharged with a prescription for morphine at home and antibiotics.  Questions and concerns were addressed.   The family was encouraged to call with questions or concerns.    Primary  Decision Maker:  PATIENT   SUMMARY OF RECOMMENDATIONS    Discharge to home with Hospice services. Patient does not desire interventions. He does not desire re-hospitalization ever. Agree with discharge medications including morphine, doxycycline, and prednisone.   Code Status/Advance Care Planning:  DNR   Symptom Management:   As above.  Additional Recommendations (Limitations, Scope, Preferences):  Full Comfort Care  Palliative Prophylaxis:   Frequent Pain Assessment   Prognosis:    less than 6 months secondary to severe PVD with occlusion and no surgical intervention, COPD with continued tobacco use, DHF, and recent PE.  Discharge Planning: Home with Hospice      Primary Diagnoses: Present on Admission: . Acute respiratory failure with hypercapnia (HCC) . Pulmonary embolism (HCC) . COPD (chronic obstructive pulmonary disease) (HCC) . PVD (peripheral vascular disease) (HCC) . Tobacco abuse . Cellulitis . AKI (acute kidney injury) (HCC)   I have reviewed the medical record, interviewed the patient and family, and examined the patient. The following aspects are pertinent.  Past Medical History:  Diagnosis Date  . (HFpEF) heart failure with preserved ejection fraction (HCC)   . Cerebellar stroke (HCC)   .  Chronic hypercapnic respiratory failure ([place])   . Chronic hypoxemic respiratory failure ([place])   . Femoral artery occlusion (HCC)   . Pulmonary embolism ([place])   . Thoracoabdominal aneurysm ([place])   . Tobacco abuse    Social History   Socioeconomic History  . Marital status: Single    Spouse name: Not on file  . Number of children: Not on file  . Years of education: Not on file  . Highest education level: Not on file  Occupational History  . Not on file  Social Needs  . Financial resource strain: Not on file  . Food insecurity    Worry: Not on file    Inability: Not on file  . Transportation needs    Medical: Not on file    Non-medical: Not on  file  Tobacco Use  . Smoking status: Not on file  Substance and Sexual Activity  . Alcohol use: Not on file  . Drug use: Not on file  . Sexual activity: Not on file  Lifestyle  . Physical activity    Days per week: Not on file    Minutes per session: Not on file  . Stress: Not on file  Relationships  . Social Herbalist on phone: Not on file    Gets together: Not on file    Attends religious service: Not on file    Active member of club or organization: Not on file    Attends meetings of clubs or organizations: Not on file    Relationship status: Not on file  Other Topics Concern  . Not on file  Social History Narrative  . Not on file   Family History  Family history unknown: Yes   Scheduled Meds: . budesonide (PULMICORT) nebulizer solution  0.5 mg Nebulization BID  . chlorhexidine  15 mL Mouth Rinse BID  . ipratropium-albuterol  3 mL Nebulization Q6H  . mouth rinse  15 mL Mouth Rinse q12n4p  . methylPREDNISolone (SOLU-MEDROL) injection  60 mg Intravenous Q12H  .  morphine injection  1 mg Intravenous Once  . nicotine  21 mg Transdermal Daily  . oxyCODONE  5 mg Oral Once   Continuous Infusions: . azithromycin 250 mL/hr at 04/12/19 1300  . cefTRIAXone (ROCEPHIN)  IV Stopped (04/12/19 1202)  . heparin 1,500 Units/hr (04/12/19 1300)   PRN Meds:.acetaminophen, ipratropium-albuterol, labetalol, ondansetron **OR** ondansetron (ZOFRAN) IV, traMADol Not on File Review of Systems complains of leg pain and swelling as well as weakness.  Physical Exam  Well developed obese gentleman sitting up in a recliner chair A&O x4 cooperative  Skin flushed with numerous telangectasias RLE swollen > LLE  Vital Signs: BP 111/75   Pulse 90   Temp 98 F (36.7 C) (Oral)   Resp (!) 25   Ht 5' 10.98" (1.803 m)   Wt 117.2 kg   SpO2 92%   BMI 36.05 kg/m  Pain Scale: 0-10   Pain Score: 8    SpO2: SpO2: 92 % O2 Device:SpO2: 92 % O2 Flow Rate: .O2 Flow Rate (L/min): 12  L/min  IO: Intake/output summary:   Intake/Output Summary (Last 24 hours) at 04/12/2019 1537 Last data filed at 04/12/2019 1300 Gross per 24 hour  Intake 2979.51 ml  Output 1625 ml  Net 1354.51 ml    LBM:   Baseline Weight: Weight: 108.9 kg Most recent weight: Weight: 117.2 kg     Palliative Assessment/Data: 40%     Time In: 3:00 Time Out: 3:30 Time Total:  30 min. Visit consisted of counseling and education dealing with the complex and emotionally intense issues surrounding the need for palliative care and symptom management in the setting of serious and potentially life-threatening illness. Greater than 50%  of this time was spent counseling and coordinating care related to the above assessment and plan.  Signed by: Norvel RichardsMarianne Aundreya Souffrant, PA-C Palliative Medicine Pager: [phone]  Please contact Palliative Medicine Team phone at [phone] for questions and concerns.  For individual provider: See Loretha StaplerAmion

## 2019-04-12 NOTE — Progress Notes (Signed)
ANTICOAGULATION CONSULT NOTE - Follow Up Consult  Pharmacy Consult for heparin Indication:HX PE Apixaban PTA on hold  Labs: Recent Labs    04/10/19 1439 04/10/19 1521 04/10/19 1830  04/10/19 1840 04/11/19 0250  04/11/19 1943 04/12/19 0249 04/12/19 1040  HGB 15.1 16.3  --   --   --  13.8  --   --  14.5  --   HCT 51.1 48.0  --   --   --  44.9  --   --  46.1  --   PLT 295  --   --   --   --  225  --   --  240  --   APTT  --   --   --   --  34 >200*  --   --   --   --   HEPARINUNFRC  --   --   --    < > <0.10* 0.95*   < > 0.31 0.27* 0.38  CREATININE 1.50*  --   --   --   --  1.33*  --   --  1.14  --   TROPONINIHS 5  --  <2  --   --   --   --   --   --   --    < > = values in this interval not displayed.    Assessment: 73yo male on heparin with initial dosing for PE and possible acute vs subacute PE, on apixaban PTA (LD 10/14- however baseline heparin level was not elevated). CT negative for acute PE   Heparin level 0.38 at goal on heparin drip 1500 units/hr. Hgb and pltc stable  No s/sx of bleeding or infusion issues.   Goal of Therapy:  Heparin level 0.3-0.7 units/ml   Plan:  Continue heparin infusion 1500 units/hr Monitor daily HL, CBC, and for s/sx of bleeding   Bonnita Nasuti Pharm.D. CPP, BCPS Clinical Pharmacist [phone] 04/12/2019 1:17 PM    Please check AMION for all [place] phone numbers After 10:00 PM, call [place] [phone]  04/12/2019,1:15 PM

## 2019-04-12 NOTE — Progress Notes (Signed)
NAME:  Nathan Cantu, MRN:  [id number], DOB:  07/21/1945, LOS: 2 ADMISSION DATE:  04/10/2019, CONSULTATION DATE:  10/15 REFERRING MD:  Dr. Clayborne Dana, CHIEF COMPLAINT:  SOB  Brief History   73 year old male with multiple medical problems admitted 10/15 with complaints or SOB and leg pain. Recent Dx PE as well. Pulmonary consultation requested for evaluation of dyspnea.   History of present illness   73 year old with multiple medical problems. He was recently admitted to [facility] on 10/2 where he underwent treatment for new arterial occlusion of left SFA with ischemic toes, mixed respiratory failure requiring BiPAP, PE, and COPD exacerbation. Vascular surgery evaluated him at that time and initially recommended conservative management with anti-coagulation. They then determined he would likely require toe amputation,which the patient refused at that time. He was also found to have PE at that time. He was discharged on eliquis. He was ultimately seen by palliative care and was discharged on home hospice services 10/9. Then 10/15 he was referred to [facility] ED from podiatry office with complaints of left leg pain and dyspnea. There was concern for worsening of his known vascular injury and the appearance of infected foot. Vascuar surgery considering a trip to the OR for SFA stent and possible toe amputation. Became somnolent in ED. ABG showed hypercarbia. BiPAP pending COVID test results.   PCCM consulted for known PE history and pre op clearance.   Past Medical History   has a past medical history of (HFpEF) heart failure with preserved ejection fraction (HCC), Cerebellar stroke (HCC), Chronic hypercapnic respiratory failure (HCC), Chronic hypoxemic respiratory failure (HCC), Femoral artery occlusion (HCC), Pulmonary embolism (HCC), Thoracoabdominal aneurysm (HCC), and Tobacco abuse.  Significant Hospital Events   [facility] admit 10/2-10/9 Admit 10/15  Consults:  Vascular PCCM  Procedures:      Significant Diagnostic Tests:  CTA chest 10/15 > chronic LLL atelectasis and diaphragm dysfunction CT abdomen pelvis 10/15 > chronic dissection, abrupt cutoff R SFA, similar to prior TTE: findings as below but essentially preserved EF, mild AS, mild pulm HTN, mild TR Micro Data:  BCx2 10/15 > Urine 10/15 >  Antimicrobials:  Vancomycin 10/15 > Rocephin 10/15 >  Interim history/subjective:  Refused BiPap last night, palliative care consult pending (per Dr. Alanda Slim). Patient indicates today that he is unsure of whether we can help him. "I'm old, and I may just want to go home with some antibiotics".   Objective   Blood pressure 123/66, pulse 97, temperature 98.2 F (36.8 C), temperature source Oral, resp. rate (!) 25, height 5' 10.98" (1.803 m), weight 117.2 kg, SpO2 98 %.        Intake/Output Summary (Last 24 hours) at 04/12/2019 0920 Last data filed at 04/12/2019 0810 Gross per 24 hour  Intake 1497.71 ml  Output 950 ml  Net 547.71 ml   Filed Weights   04/10/19 1923 04/12/19 0500  Weight: 108.9 kg 117.2 kg    Examination: GEN: obese chronically ill appearing man in NAD HEENT: AT/[place], nasal cannula in place CV: RRR, III/VI SEM at LSB consistent with AS.  PULM: Shallow inspiratory effort, no wheezing GI: Protuberant, soft EXT: 1+ edema on L, trace on R, left foot erythmatous with ulcer and early eschar of left toe NEURO: moves all 4 ext to command; interactive; speech nl PSYCH: oriented but lacks insight SKIN: chronic vascular insiffuciency changes   Resolved Hospital Problem list   NA  Assessment & Plan:  73 yo M w/ recent admission to [facility]  with pulmonary embolism, cellulitis, SFA occlusion, thoracoabdominal aneurysm. Refused intervention at [facility] and discharged with palliative care. [facility] and [facility] and SOB.   Chronic hypercapnic respiratory failure L hemidiaphragm elevation from prior issues with severe pancreatitis  Reported hx of COPD, no PFTs on file Active smoker -previous L hydropneumothorax thought to be related to bad pancreatitis back in 2018.  This was left alone; what I suspect is this was actually a pseudocyst and the pancreatitis destroyed L diaphragm. 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  We will be happy to follow him postop as needed, likely need for postop intubation/ventilation.  TTE: 1. Left ventricular ejection fraction, by visual estimation, is 60 to 65%. The left ventricle has normal function. Normal left ventricular size. There is no left ventricular hypertrophy.  2. Elevated mean left atrial pressure.  3. Left ventricular diastolic Doppler parameters are consistent with impaired relaxation pattern of LV diastolic filling.  4. Global right ventricle has normal systolic function.The right ventricular size is normal. No increase in right ventricular wall thickness.  5. Left atrial size was mildly dilated.  6. Right atrial size was normal.  7. Moderate mitral  annular calcification.  8. The mitral valve is normal in structure. No evidence of mitral valve regurgitation.  9. The tricuspid valve is normal in structure. Tricuspid valve regurgitation is mild. 10. The aortic valve is tricuspid Aortic valve regurgitation was not visualized by color flow Doppler. Mild aortic valve stenosis. 11. The pulmonic valve was grossly normal. Pulmonic valve regurgitation is not visualized by color flow Doppler. 12. Moderately elevated pulmonary artery systolic pressure. 13. The inferior vena cava is normal in size with greater than 50% respiratory variability, suggesting right atrial pressure of 3 mmHg.  Best practice:  Diet: cardiac diet trial Pain/Anxiety/Delirium protocol (if indicated): na  VAP protocol (if indicated): na DVT prophylaxis: heparin gtt Glucose control: monitor  Mobility: bedrest  Code Status: DNR  Family Communication: per primary Disposition: SDU; agree with palliative care consult as patient ambivalent about more aggressive care  Labs   CBC: Recent Labs  Lab 04/10/19 1439 04/10/19 1521 04/11/19 0250 04/12/19 0249  WBC 15.7*  --  13.6* 15.1*  NEUTROABS 12.4*  --   --   --   HGB 15.1 16.3 13.8 14.5  HCT 51.1 48.0 44.9 46.1  MCV 101.8*  --  98.9 97.7  PLT 295  --  225 240    Basic Metabolic Panel: Recent Labs  Lab 04/10/19 1439  04/10/19 1521 04/10/19 1830 04/11/19 0250 04/12/19 0249  NA 138 136  --  135 137  K 5.6* 5.6*  --  4.9 5.1  CL 101  --   --  102 103  CO2 24  --   --  26 26  GLUCOSE 89  --   --  91 144*  BUN 31*  --   --  26* 25*  CREATININE 1.50*  --   --  1.33* 1.14  CALCIUM 8.6*  --   --  8.0* 8.6*  MG  --   --  0.6* 2.5* 2.1  PHOS  --   --  1.4* 4.8* 3.5   GFR: Estimated Creatinine Clearance: 77.4 mL/min (by C-G formula based on SCr of 1.14 mg/dL). Recent Labs  Lab 04/10/19 1439 04/10/19 1441 04/11/19 0250 04/12/19 0249  WBC 15.7*  --  13.6* 15.1*  LATICACIDVEN  --  1.8 0.9  --     Liver  Function Tests: Recent Labs  Lab 04/10/19 1439 04/11/19 0250  AST 15 13*  ALT 15 14  ALKPHOS 59 49  BILITOT 0.4 0.8  PROT 6.1* 5.3*  ALBUMIN 3.0* 2.6*   Recent Labs  Lab 04/10/19 1439  LIPASE 22   No results for input(s): AMMONIA in the last 168 hours.  ABG    Component Value Date/Time   PHART 7.298 (L) 04/12/2019 0425   PCO2ART 64.0 (H) 04/12/2019 0425   PO2ART 52.7 (L) 04/12/2019 0425   HCO3 30.4 (H) 04/12/2019 0425   TCO2 31 04/10/2019 1521   ACIDBASEDEF 1.0 04/10/2019 1521   O2SAT 84.9 04/12/2019 0425     Coagulation Profile: No results for input(s): INR, PROTIME in the last 168 hours.  Cardiac Enzymes: No results for input(s): CKTOTAL, CKMB, CKMBINDEX, TROPONINI in the last 168 hours.  HbA1C: No results found for: HGBA1C  CBG: Recent Labs  Lab 04/10/19 1416  GLUCAP 77    Review of Systems:    Positive Symptoms in bold:  Constitutional fevers, chills, weight loss, fatigue, anorexia, malaise  Eyes decreased vision, double vision, eye irritation  Ears, Nose, Mouth, Throat sore throat, trouble swallowing, sinus congestion  Cardiovascular chest pain, paroxysmal nocturnal dyspnea, lower ext edema, palpitations   Respiratory SOB, cough, DOE, hemoptysis, wheezing  Gastrointestinal nausea, vomiting, diarrhea  Genitourinary burning with urination, trouble urinating  Musculoskeletal joint aches, joint swelling, back pain  Integumentary  rashes, skin lesions  Neurological focal weakness, focal numbness, trouble speaking, headaches  Psychiatric depression, anxiety, confusion  Endocrine polyuria, polydipsia, cold intolerance, heat intolerance  Hematologic abnormal bruising, abnormal bleeding, unexplained nose bleeds  Allergic/Immunologic recurrent infections, hives, swollen lymph nodes     Past Medical History  He,  has a past medical history of (HFpEF) heart failure with preserved ejection fraction (HCC), Cerebellar stroke (HCC), Chronic hypercapnic  respiratory failure (HCC), Chronic hypoxemic respiratory failure (HCC), Femoral artery occlusion (HCC), Pulmonary embolism (HCC), Thoracoabdominal aneurysm (HCC), and Tobacco abuse.   Surgical History   Tonsillectomy  Social History  25 pack year smoker, still smoking Denies etoh  Family History   His Family history is unknown by patient.   Allergies Not on File   Home Medications  Prior to Admission medications   Not on File      I have independently seen and examined the patient, reviewed data, and developed an assessment and plan. A total of 31 minutes were spent in critical care assessment and medical decision making. This critical care time does not reflect procedure time,  or teaching time or supervisory time of PA/NP/Med student/Med Resident, etc but could involve care discussion time.   Bonna Gains, MD PhD 04/12/19 9:33 AM

## 2019-04-14 SURGERY — ABDOMINAL AORTOGRAM W/LOWER EXTREMITY
Anesthesia: LOCAL | Laterality: Bilateral

## 2019-04-15 LAB — CULTURE, BLOOD (ROUTINE X 2)
Culture: NO GROWTH
Culture: NO GROWTH
Special Requests: ADEQUATE

## 2019-04-26 ENCOUNTER — Emergency Department (HOSPITAL_COMMUNITY): Payer: Medicare Other

## 2019-04-26 ENCOUNTER — Other Ambulatory Visit: Payer: Self-pay

## 2019-04-26 ENCOUNTER — Encounter (HOSPITAL_COMMUNITY): Payer: Self-pay | Admitting: Emergency Medicine

## 2019-04-26 ENCOUNTER — Inpatient Hospital Stay (HOSPITAL_COMMUNITY)
Admission: EM | Admit: 2019-04-26 | Discharge: 2019-04-28 | DRG: 189 | Disposition: A | Discharge status: Home / Self Care | Payer: Medicare Other | Attending: Family Medicine | Admitting: Family Medicine

## 2019-04-26 DIAGNOSIS — Z66 Do not resuscitate: Secondary | ICD-10-CM | POA: Diagnosis present

## 2019-04-26 DIAGNOSIS — Z8673 Personal history of transient ischemic attack (TIA), and cerebral infarction without residual deficits: Secondary | ICD-10-CM

## 2019-04-26 DIAGNOSIS — I5030 Unspecified diastolic (congestive) heart failure: Secondary | ICD-10-CM | POA: Diagnosis present

## 2019-04-26 DIAGNOSIS — I712 Thoracic aortic aneurysm, without rupture: Secondary | ICD-10-CM | POA: Diagnosis present

## 2019-04-26 DIAGNOSIS — Z20828 Contact with and (suspected) exposure to other viral communicable diseases: Secondary | ICD-10-CM | POA: Diagnosis present

## 2019-04-26 DIAGNOSIS — I716 Thoracoabdominal aortic aneurysm, without rupture: Secondary | ICD-10-CM | POA: Diagnosis present

## 2019-04-26 DIAGNOSIS — I7100 Dissection of unspecified site of aorta: Secondary | ICD-10-CM | POA: Diagnosis present

## 2019-04-26 DIAGNOSIS — D72829 Elevated white blood cell count, unspecified: Secondary | ICD-10-CM | POA: Diagnosis not present

## 2019-04-26 DIAGNOSIS — J9621 Acute and chronic respiratory failure with hypoxia: Secondary | ICD-10-CM | POA: Diagnosis present

## 2019-04-26 DIAGNOSIS — L03032 Cellulitis of left toe: Secondary | ICD-10-CM | POA: Diagnosis present

## 2019-04-26 DIAGNOSIS — Z87891 Personal history of nicotine dependence: Secondary | ICD-10-CM

## 2019-04-26 DIAGNOSIS — I724 Aneurysm of artery of lower extremity: Secondary | ICD-10-CM | POA: Diagnosis present

## 2019-04-26 DIAGNOSIS — I70202 Unspecified atherosclerosis of native arteries of extremities, left leg: Secondary | ICD-10-CM | POA: Diagnosis present

## 2019-04-26 DIAGNOSIS — M160 Bilateral primary osteoarthritis of hip: Secondary | ICD-10-CM | POA: Diagnosis present

## 2019-04-26 DIAGNOSIS — Z86711 Personal history of pulmonary embolism: Secondary | ICD-10-CM | POA: Diagnosis not present

## 2019-04-26 DIAGNOSIS — L03116 Cellulitis of left lower limb: Secondary | ICD-10-CM | POA: Diagnosis present

## 2019-04-26 DIAGNOSIS — Z7901 Long term (current) use of anticoagulants: Secondary | ICD-10-CM

## 2019-04-26 DIAGNOSIS — J441 Chronic obstructive pulmonary disease with (acute) exacerbation: Secondary | ICD-10-CM | POA: Diagnosis present

## 2019-04-26 DIAGNOSIS — Z79899 Other long term (current) drug therapy: Secondary | ICD-10-CM | POA: Diagnosis not present

## 2019-04-26 DIAGNOSIS — Z515 Encounter for palliative care: Secondary | ICD-10-CM | POA: Diagnosis present

## 2019-04-26 DIAGNOSIS — J9622 Acute and chronic respiratory failure with hypercapnia: Secondary | ICD-10-CM | POA: Diagnosis present

## 2019-04-26 DIAGNOSIS — W19XXXA Unspecified fall, initial encounter: Secondary | ICD-10-CM

## 2019-04-26 DIAGNOSIS — Y92009 Unspecified place in unspecified non-institutional (private) residence as the place of occurrence of the external cause: Secondary | ICD-10-CM

## 2019-04-26 LAB — URINALYSIS, ROUTINE W REFLEX MICROSCOPIC
Bilirubin Urine: NEGATIVE
Glucose, UA: NEGATIVE mg/dL
Hgb urine dipstick: NEGATIVE
Ketones, ur: NEGATIVE mg/dL
Leukocytes,Ua: NEGATIVE
Nitrite: NEGATIVE
Protein, ur: NEGATIVE mg/dL
Specific Gravity, Urine: 1.013 (ref 1.005–1.030)
pH: 7 (ref 5.0–8.0)

## 2019-04-26 LAB — CBC WITH DIFFERENTIAL/PLATELET
Abs Immature Granulocytes: 0.19 10*3/uL — ABNORMAL HIGH (ref 0.00–0.07)
Basophils Absolute: 0.1 10*3/uL (ref 0.0–0.1)
Basophils Relative: 0 %
Eosinophils Absolute: 0.1 10*3/uL (ref 0.0–0.5)
Eosinophils Relative: 0 %
HCT: 49.5 % (ref 39.0–52.0)
Hemoglobin: 15.7 g/dL (ref 13.0–17.0)
Immature Granulocytes: 1 %
Lymphocytes Relative: 10 %
Lymphs Abs: 1.7 10*3/uL (ref 0.7–4.0)
MCH: 30.8 pg (ref 26.0–34.0)
MCHC: 31.7 g/dL (ref 30.0–36.0)
MCV: 97.1 fL (ref 80.0–100.0)
Monocytes Absolute: 1.1 10*3/uL — ABNORMAL HIGH (ref 0.1–1.0)
Monocytes Relative: 6 %
Neutro Abs: 14.9 10*3/uL — ABNORMAL HIGH (ref 1.7–7.7)
Neutrophils Relative %: 83 %
Platelets: 219 10*3/uL (ref 150–400)
RBC: 5.1 MIL/uL (ref 4.22–5.81)
RDW: 16.4 % — ABNORMAL HIGH (ref 11.5–15.5)
WBC: 18.1 10*3/uL — ABNORMAL HIGH (ref 4.0–10.5)
nRBC: 0 % (ref 0.0–0.2)

## 2019-04-26 LAB — APTT: aPTT: 28 seconds (ref 24–36)

## 2019-04-26 LAB — POCT I-STAT 7, (LYTES, BLD GAS, ICA,H+H)
Acid-Base Excess: 4 mmol/L — ABNORMAL HIGH (ref 0.0–2.0)
Bicarbonate: 32.9 mmol/L — ABNORMAL HIGH (ref 20.0–28.0)
Calcium, Ion: 1.27 mmol/L (ref 1.15–1.40)
HCT: 47 % (ref 39.0–52.0)
Hemoglobin: 16 g/dL (ref 13.0–17.0)
O2 Saturation: 89 %
Potassium: 4.7 mmol/L (ref 3.5–5.1)
Sodium: 134 mmol/L — ABNORMAL LOW (ref 135–145)
TCO2: 35 mmol/L — ABNORMAL HIGH (ref 22–32)
pCO2 arterial: 66.6 mmHg (ref 32.0–48.0)
pH, Arterial: 7.302 — ABNORMAL LOW (ref 7.350–7.450)
pO2, Arterial: 64 mmHg — ABNORMAL LOW (ref 83.0–108.0)

## 2019-04-26 LAB — COMPREHENSIVE METABOLIC PANEL
ALT: 15 U/L (ref 0–44)
AST: 23 U/L (ref 15–41)
Albumin: 3.2 g/dL — ABNORMAL LOW (ref 3.5–5.0)
Alkaline Phosphatase: 66 U/L (ref 38–126)
Anion gap: 12 (ref 5–15)
BUN: 21 mg/dL (ref 8–23)
CO2: 29 mmol/L (ref 22–32)
Calcium: 9 mg/dL (ref 8.9–10.3)
Chloride: 96 mmol/L — ABNORMAL LOW (ref 98–111)
Creatinine, Ser: 1.14 mg/dL (ref 0.61–1.24)
GFR calc Af Amer: >60 mL/min (ref >60)
GFR calc non Af Amer: >60 mL/min (ref >60)
Glucose, Bld: 92 mg/dL (ref 70–99)
Potassium: 4.8 mmol/L (ref 3.5–5.1)
Sodium: 137 mmol/L (ref 135–145)
Total Bilirubin: 0.7 mg/dL (ref 0.3–1.2)
Total Protein: 6.1 g/dL — ABNORMAL LOW (ref 6.5–8.1)

## 2019-04-26 LAB — AMMONIA: Ammonia: 25 umol/L (ref 9–35)

## 2019-04-26 LAB — SARS CORONAVIRUS 2 BY RT PCR (HOSPITAL ORDER, PERFORMED IN ~~LOC~~ HOSPITAL LAB): SARS Coronavirus 2: NEGATIVE

## 2019-04-26 LAB — PROTIME-INR
INR: 0.9 (ref 0.8–1.2)
Prothrombin Time: 12.5 seconds (ref 11.4–15.2)

## 2019-04-26 LAB — LACTIC ACID, PLASMA: Lactic Acid, Venous: 1.8 mmol/L (ref 0.5–1.9)

## 2019-04-26 MED ORDER — LORAZEPAM 2 MG/ML IJ SOLN
1.0000 mg | Freq: Four times a day (QID) | INTRAMUSCULAR | Status: DC
  Administered 2019-04-26: 21:00:00 1 mg via INTRAVENOUS
  Filled 2019-04-26: qty 1

## 2019-04-26 MED ORDER — ACETAMINOPHEN 325 MG PO TABS
650.0000 mg | ORAL_TABLET | Freq: Four times a day (QID) | ORAL | Status: DC | PRN
  Administered 2019-04-27: 650 mg via ORAL
  Filled 2019-04-26: qty 2

## 2019-04-26 MED ORDER — VANCOMYCIN HCL IN DEXTROSE 1-5 GM/200ML-% IV SOLN: 1000.0000 mg | Freq: Once | INTRAVENOUS | Status: DC

## 2019-04-26 MED ORDER — HALOPERIDOL LACTATE 5 MG/ML IJ SOLN
0.5000 mg | INTRAMUSCULAR | Status: DC | PRN
  Administered 2019-04-27 – 2019-04-28 (×2): 0.5 mg via INTRAVENOUS
  Filled 2019-04-26 (×2): qty 1

## 2019-04-26 MED ORDER — ONDANSETRON 4 MG PO TBDP: 4.0000 mg | ORAL_TABLET | Freq: Four times a day (QID) | ORAL | Status: DC | PRN

## 2019-04-26 MED ORDER — MORPHINE 100MG IN NS 100ML (1MG/ML) PREMIX INFUSION
5.0000 mg/h | INTRAVENOUS | Status: DC
  Filled 2019-04-26: qty 100

## 2019-04-26 MED ORDER — GLYCOPYRROLATE 1 MG PO TABS: 1.0000 mg | ORAL_TABLET | ORAL | Status: DC | PRN

## 2019-04-26 MED ORDER — GLYCOPYRROLATE 0.2 MG/ML IJ SOLN: 0.2000 mg | INTRAMUSCULAR | Status: DC | PRN

## 2019-04-26 MED ORDER — VANCOMYCIN HCL 10 G IV SOLR: 1750.0000 mg | INTRAVENOUS | Status: DC

## 2019-04-26 MED ORDER — SODIUM CHLORIDE 0.9 % IV SOLN: 250.0000 mL | INTRAVENOUS | Status: DC | PRN

## 2019-04-26 MED ORDER — GLYCOPYRROLATE 0.2 MG/ML IJ SOLN
0.4000 mg | Freq: Three times a day (TID) | INTRAMUSCULAR | Status: DC
  Administered 2019-04-26 – 2019-04-28 (×3): 0.4 mg via INTRAVENOUS
  Filled 2019-04-26 (×3): qty 2

## 2019-04-26 MED ORDER — BIOTENE DRY MOUTH MT LIQD: 15.0000 mL | OROMUCOSAL | Status: DC | PRN

## 2019-04-26 MED ORDER — MORPHINE BOLUS VIA INFUSION
4.0000 mg | INTRAVENOUS | Status: DC | PRN
  Filled 2019-04-26: qty 4

## 2019-04-26 MED ORDER — ONDANSETRON HCL 4 MG/2ML IJ SOLN: 4.0000 mg | Freq: Four times a day (QID) | INTRAMUSCULAR | Status: DC | PRN

## 2019-04-26 MED ORDER — VANCOMYCIN HCL 10 G IV SOLR
2000.0000 mg | Freq: Once | INTRAVENOUS | Status: DC
  Administered 2019-04-26: 2000 mg via INTRAVENOUS
  Filled 2019-04-26: qty 2000

## 2019-04-26 MED ORDER — SODIUM CHLORIDE 0.9% FLUSH: 3.0000 mL | INTRAVENOUS | Status: DC | PRN

## 2019-04-26 MED ORDER — SODIUM CHLORIDE 0.9 % IV SOLN
2.0000 g | Freq: Once | INTRAVENOUS | Status: AC
  Administered 2019-04-26: 2 g via INTRAVENOUS
  Filled 2019-04-26: qty 2

## 2019-04-26 MED ORDER — HALOPERIDOL 0.5 MG PO TABS
0.5000 mg | ORAL_TABLET | ORAL | Status: DC | PRN
  Filled 2019-04-26: qty 1

## 2019-04-26 MED ORDER — SODIUM CHLORIDE 0.9 % IV SOLN
1.0000 g | Freq: Once | INTRAVENOUS | Status: DC
  Filled 2019-04-26: qty 10

## 2019-04-26 MED ORDER — HALOPERIDOL LACTATE 2 MG/ML PO CONC: 0.5000 mg | ORAL | Status: DC | PRN

## 2019-04-26 MED ORDER — SODIUM CHLORIDE 0.9% FLUSH
3.0000 mL | Freq: Two times a day (BID) | INTRAVENOUS | Status: DC
  Administered 2019-04-26 – 2019-04-27 (×3): 3 mL via INTRAVENOUS

## 2019-04-26 MED ORDER — POLYVINYL ALCOHOL 1.4 % OP SOLN: 1.0000 [drp] | Freq: Four times a day (QID) | OPHTHALMIC | Status: DC | PRN

## 2019-04-26 MED ORDER — GLYCOPYRROLATE 0.2 MG/ML IJ SOLN
0.2000 mg | INTRAMUSCULAR | Status: DC | PRN
  Administered 2019-04-26: 18:00:00 0.2 mg via INTRAVENOUS
  Filled 2019-04-26: qty 1

## 2019-04-26 MED ORDER — ACETAMINOPHEN 650 MG RE SUPP: 650.0000 mg | Freq: Four times a day (QID) | RECTAL | Status: DC | PRN

## 2019-04-26 MED ORDER — NALOXONE HCL 0.4 MG/ML IJ SOLN
0.4000 mg | Freq: Once | INTRAMUSCULAR | Status: AC
  Administered 2019-04-26: 10:00:00 0.4 mg via INTRAVENOUS
  Filled 2019-04-26: qty 1

## 2019-04-26 MED ORDER — SODIUM CHLORIDE 0.9 % IV SOLN
1000.0000 mL | INTRAVENOUS | Status: DC
  Administered 2019-04-26: 1000 mL via INTRAVENOUS

## 2019-04-26 MED ORDER — SODIUM CHLORIDE 0.9 % IV SOLN: 2.0000 g | Freq: Three times a day (TID) | INTRAVENOUS | Status: DC

## 2019-04-26 NOTE — ED Notes (Signed)
Sister called -- Prentiss Bells [phone], is in [place]- has talked with the Hospice/Palliative care nurse -- [facility] [place]--

## 2019-04-26 NOTE — Plan of Care (Signed)
Consulted for admission.  However, review of patient's chart from recent hospitalization from 10/15 -10/17 notes that patient was sent to dependent living facility on hospice.  Palliative care was involved in his care and noted that the patient is to be DNI/DNR with goals of comfort care measures only, and for patient not be readmitted into the hospital.

## 2019-04-26 NOTE — ED Notes (Signed)
Pt has a hematoma to left side of head, skin tear to left hand-

## 2019-04-26 NOTE — ED Notes (Signed)
O2 sats dropping into 60s and 70s with O2 at 3L/[place] -- Dr. Sabra Heck Notified -- orders received.

## 2019-04-26 NOTE — ED Notes (Signed)
Condom  Cath placed-- u/a and urine culture obtained

## 2019-04-26 NOTE — Progress Notes (Signed)
Received patient from ED. Patient alert to self. Seems to be irritable when woken up. Patient made comfortable in bed. Will continue to monitor.

## 2019-04-26 NOTE — ED Notes (Addendum)
COVID-19 info: [email or web address] Address: [address], [place], [place] [address] Hours:  Open 24 hours Phone: [phone]  Unsure of admission at this time to [place]--

## 2019-04-26 NOTE — ED Provider Notes (Addendum)
[facility] EMERGENCY DEPARTMENT Provider Note   CSN: [id number] Arrival date & time: 04/26/19  4196     History   Chief Complaint Chief Complaint  Patient presents with  . Fall  . lethargic    HPI Nathan Cantu is a 73 y.o. male.     HPI  This patient is a 73 year old male, he has a known history of a cerebellar stroke, chronic hypercapnic respiratory failure as well as hypoxemic respiratory failure, femoral artery aneurysm, pulmonary embolism, thoracoabdominal aneurysm.  He currently is under hospice care, palliative care, DO NOT RESUSCITATE and living in an independent living facility based on the nurses report from the facility where the patient lives and to which the EMS team was called several times last night.  They were called out twice, evidently Narcan had been given at 1 of those visits, the patient refused transport however he has had multiple falls through the evening and presented with a large hematoma to the left frontal area, this time the EMS transport team brought him to the hospital noting him to require some oxygen due to hypoxia of 88%.  The patient will transiently wake up, answer questions and follow commands but then very quickly fall back asleep.  Level 5 caveat applies secondary to altered mental status.  A thorough review of the medical record shows that the patient had been admitted to the hospital most recently 2 weeks ago, he was admitted 2 weeks prior to that as well  The most recent admission to the hospital was due to acute on chronic respiratory failure with hypercarbia requiring BiPAP, there was consultations from both the critical care service and vascular surgery, the patient was deemed to not be a surgical candidate.  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     Past Medical History:  Diagnosis Date  . (HFpEF) heart failure with preserved ejection fraction ([place])   . Cerebellar stroke ([place])   . Chronic hypercapnic respiratory failure ([place])   . Chronic hypoxemic respiratory failure ([place])   . Femoral artery occlusion ([facility])   . Pulmonary embolism ([place])   . Thoracoabdominal aneurysm ([place])   . Tobacco abuse     Patient Active Problem List   Diagnosis Date Noted  . Palliative care encounter   . Acute respiratory failure with hypercapnia ([place]) 04/10/2019  . Pulmonary embolism ([place]) 04/10/2019  . COPD (chronic obstructive pulmonary disease) ([place]) 04/10/2019  . PVD (peripheral vascular disease) ([place]) 04/10/2019  . Tobacco abuse 04/10/2019  . Cellulitis 04/10/2019  . AKI (acute kidney injury) ([place]) 04/10/2019  . Hypomagnesemia 04/10/2019    History reviewed. No pertinent surgical history.      Home Medications    Prior to Admission medications   Medication Sig Start Date End Date Taking? Authorizing Provider  Albuterol Sulfate 2.5 MG/0.5ML NEBU Inhale 2.5 mg into the lungs every 4 (four) hours as needed (shortness of breath/wheezing).     [provider]  amLODipine (NORVASC) 10 MG tablet Take 5 mg by mouth daily.     [provider]  apixaban Arne [place])  5 MG TABS tablet Take 5 mg by mouth 2 (two) times daily.    [provider]  arformoterol (BROVANA) 15 MCG/2ML NEBU Take 15 mcg by nebulization 2 (two) times daily. As directed    [provider]  budesonide (PULMICORT) 0.5 MG/2ML nebulizer solution Take 0.5 mg by nebulization 2 (two) times daily. As directed    [provider]  doxycycline (VIBRA-TABS) 100 MG tablet Take 1 tablet (100 mg total) by  mouth 2 (two) times daily. 04/12/19   Almon HerculesGonfa, Taye T, MD  glycopyrrolate (ROBINUL) 2 MG tablet Take 1 tablet (2 mg total) by mouth 3 (three) times daily as needed (oversecretion). 04/12/19   Almon HerculesGonfa, Taye T, MD  lidocaine (XYLOCAINE) 5 % ointment Apply 1 application topically See admin instructions. Apply topically to left foot three times daily as directed    [provider]  LORazepam (ATIVAN) 0.5 MG tablet Take 1 tablet (0.5 mg total) by mouth every 4 (four) hours as needed for anxiety. 04/12/19 04/11/20  Almon HerculesGonfa, Taye T, MD  morphine (ROXANOL) 20 MG/ML concentrated solution Take 0.5 mLs (10 mg total) by mouth every 3 (three) hours as needed for severe pain, anxiety or shortness of breath. 04/12/19   Almon HerculesGonfa, Taye T, MD  polyethylene glycol (MIRALAX / GLYCOLAX) 17 g packet Take 17 g by mouth daily.     [provider]  predniSONE (DELTASONE) 50 MG tablet Take 1 tablet (50 mg total) by mouth daily with breakfast. 04/12/19   Almon HerculesGonfa, Taye T, MD    Family History Family History  Family history unknown: Yes    Social History Social History   Tobacco Use  . Smoking status: Not on file  Substance Use Topics  . Alcohol use: Not on file  . Drug use: Not on file     Allergies   Patient has no allergy information on record.   Review of Systems Review of Systems  Unable to perform ROS: Acuity of condition     Physical Exam Updated Vital Signs BP 114/73   Pulse 97   Resp (!) 23   Ht 1.778 m (5\' 10" )   Wt 115 kg   SpO2 90%   BMI 36.38 kg/m   Physical Exam Vitals signs and nursing note reviewed.  Constitutional:      General: He is in acute distress.     Appearance: He is well-developed. He is ill-appearing and toxic-appearing.     Comments: The patient is ill-appearing  HENT:     Head: Normocephalic.     Comments: Hematoma to the left forehead    Mouth/Throat:     Pharynx: No oropharyngeal exudate.  Eyes:     General: No scleral icterus.       Right eye: No  discharge.        Left eye: No discharge.     Conjunctiva/sclera: Conjunctivae normal.     Pupils: Pupils are equal, round, and reactive to light.  Neck:     Musculoskeletal: Normal range of motion and neck supple.     Thyroid: No thyromegaly.     Vascular: No JVD.  Cardiovascular:     Rate and Rhythm: Regular rhythm.     Heart sounds: Normal heart sounds. No murmur. No friction rub. No gallop.      Comments: Some tachycardia present Pulmonary:     Effort: Respiratory distress present.     Breath sounds: Wheezing and rales present.     Comments: The patient has scattered rhonchi rales  and wheezing, seems to have a prolonged expiratory phase, respiratory distress Abdominal:     General: Bowel sounds are normal. There is no distension.     Palpations: Abdomen is soft. There is no mass.     Tenderness: There is no abdominal tenderness.  Musculoskeletal: Normal range of motion.        General: Swelling present. No tenderness.     Right lower leg: Edema present.     Left lower leg: Edema present.     Comments: Bilateral lower extremity edema pulses are very difficult to palpate in the feet  Lymphadenopathy:     Cervical: No cervical adenopathy.  Skin:    General: Skin is warm and dry.     Findings: No erythema or rash.  Neurological:     Comments: The patient has a waxing and waning mental status, he is breathing by himself, he has a gag reflex, he will open his eyes and follow some simple commands, he can even assist in sitting up in the bed for a couple seconds before he falls back asleep  Psychiatric:        Behavior: Behavior normal.      ED Treatments / Results  Labs (all labs ordered are listed, but only abnormal results are displayed) Labs Reviewed  COMPREHENSIVE METABOLIC PANEL - Abnormal; Notable for the following components:      Result Value   Chloride 96 (*)    Total Protein 6.1 (*)    Albumin 3.2 (*)    All other components within normal limits  CBC WITH  DIFFERENTIAL/PLATELET - Abnormal; Notable for the following components:   WBC 18.1 (*)    RDW 16.4 (*)    Neutro Abs 14.9 (*)    Monocytes Absolute 1.1 (*)    Abs Immature Granulocytes 0.19 (*)    All other components within normal limits  POCT I-STAT 7, (LYTES, BLD GAS, ICA,H+H) - Abnormal; Notable for the following components:   pH, Arterial 7.302 (*)    pCO2 arterial 66.6 (*)    pO2, Arterial 64.0 (*)    Bicarbonate 32.9 (*)    TCO2 35 (*)    Acid-Base Excess 4.0 (*)    Sodium 134 (*)    All other components within normal limits  SARS CORONAVIRUS 2 BY RT PCR (HOSPITAL ORDER, PERFORMED IN [facility])  LACTIC ACID, PLASMA  APTT  PROTIME-INR  URINALYSIS, ROUTINE W REFLEX MICROSCOPIC  AMMONIA    EKG EKG Interpretation  Date/Time:  Saturday April 26 2019 09:14:13 EDT Ventricular Rate:  87 PR Interval:    QRS Duration: 105 QT Interval:  373 QTC Calculation: 449 R Axis:   -21 Text Interpretation: Sinus rhythm Borderline left axis deviation Borderline ST elevation, anterior leads since last tracing no significant change Confirmed by Eber Hong ([id number]) on 04/26/2019 9:27:26 AM   Radiology Ct Head Wo Contrast  Result Date: 04/26/2019 CLINICAL DATA:  Left forehead hematoma following a fall onto the floor. Ataxia. EXAM: CT HEAD WITHOUT CONTRAST TECHNIQUE: Contiguous axial images were obtained from the base of the skull through the vertex without intravenous contrast. COMPARISON:  None. FINDINGS: Brain: There is some limitation due to patient motion. Old or subacute right cerebellar hemisphere infarcts. Minimally enlarged ventricles and subarachnoid spaces. No intracranial hemorrhage or mass lesion. Vascular: No hyperdense vessel or unexpected calcification. Skull: Normal. Negative for fracture or focal lesion. Sinuses/Orbits: No acute finding. Other: Left frontal scalp hematoma. IMPRESSION: 1. Limited examination due to patient motion. 2.  Old or subacute right  cerebellar hemisphere infarcts. 3. Left frontal scalp hematoma without skull fracture or intracranial hemorrhage. 4. Minimal diffuse cerebral and cerebellar atrophy. Electronically Signed   By: Beckie Salts M.D.   On: 04/26/2019 10:05   Dg Chest Port 1 View  Result Date: 04/26/2019 CLINICAL DATA:  EMS was called for fall, pt found on floor- has hematoma to left side of forehead- snoring during exam. Pt unable to be awaken during exam. EXAM: PORTABLE CHEST 1 VIEW COMPARISON:  04/10/2019 and older exams FINDINGS: Cardiac silhouette mildly enlarged.  No mediastinal or hilar masses. Lung base opacities consistent with scarring or atelectasis. There are prominent bilateral bronchovascular markings, accentuated by low lung volumes. No convincing pneumonia and no evidence of pulmonary edema. No pleural effusion or pneumothorax. Skeletal structures are grossly intact. IMPRESSION: 1. No acute cardiopulmonary disease. Electronically Signed   By: Amie Portland M.D.   On: 04/26/2019 09:40    Procedures .Critical Care Performed by: Eber Hong, MD Authorized by: Eber Hong, MD   Critical care provider statement:    Critical care time (minutes):  35   Critical care time was exclusive of:  Separately billable procedures and treating other patients and teaching time   Critical care was necessary to treat or prevent imminent or life-threatening deterioration of the following conditions:  Respiratory failure   Critical care was time spent personally by me on the following activities:  Blood draw for specimens, development of treatment plan with patient or surrogate, discussions with consultants, evaluation of patient's response to treatment, examination of patient, obtaining history from patient or surrogate, ordering and performing treatments and interventions, ordering and review of laboratory studies, ordering and review of radiographic studies, pulse oximetry, re-evaluation of patient's condition and review  of old charts   (including critical care time)  Medications Ordered in ED Medications  acetaminophen (TYLENOL) tablet 650 mg (has no administration in time range)    Or  acetaminophen (TYLENOL) suppository 650 mg (has no administration in time range)  haloperidol (HALDOL) tablet 0.5 mg (has no administration in time range)    Or  haloperidol (HALDOL) 2 MG/ML solution 0.5 mg (has no administration in time range)    Or  haloperidol lactate (HALDOL) injection 0.5 mg (has no administration in time range)  ondansetron (ZOFRAN-ODT) disintegrating tablet 4 mg (has no administration in time range)    Or  ondansetron (ZOFRAN) injection 4 mg (has no administration in time range)  glycopyrrolate (ROBINUL) tablet 1 mg (has no administration in time range)    Or  glycopyrrolate (ROBINUL) injection 0.2 mg (has no administration in time range)    Or  glycopyrrolate (ROBINUL) injection 0.2 mg (has no administration in time range)  antiseptic oral rinse (BIOTENE) solution 15 mL (has no administration in time range)  polyvinyl alcohol (LIQUIFILM TEARS) 1.4 % ophthalmic solution 1 drop (has no administration in time range)  sodium chloride flush (NS) 0.9 % injection 3 mL (has no administration in time range)  sodium chloride flush (NS) 0.9 % injection 3 mL (has no administration in time range)  0.9 %  sodium chloride infusion (has no administration in time range)  morphine bolus via infusion 4 mg (has no administration in time range)  morphine 100mg  in NS (1mg /mL) infusion - premix (has no administration in time range)  glycopyrrolate (ROBINUL) injection 0.4 mg (has no administration in time range)  LORazepam (ATIVAN) injection 1 mg (has no administration in time range)  ceFEPIme (MAXIPIME) 2 g  in sodium chloride 0.9 % 100 mL IVPB (0 g Intravenous Stopped 04/26/19 1150)  naloxone (NARCAN) injection 0.4 mg (0.4 mg Intravenous Given 04/26/19 1006)     Initial Impression / Assessment and Plan /  ED Course  I have reviewed the triage vital signs and the nursing notes.  Pertinent labs & imaging results that were available during my care of the patient were reviewed by me and considered in my medical decision making (see chart for details).        This patient is very ill-appearing, he does not fact have a DO NOT RESUSCITATE and DO NOT INTUBATE order, he is likely on hospice and he admits as much when he wakes up briefly.  I suspect he has hypercarbic and hypercapnic again, he also has signs of trauma, will perform a CT scan of the brain which will further clarify a pathologic conditions, despite not being a candidate for surgical intervention as an anticoagulated patient he may benefit from treating the anticoagulation if in fact he has bleeding.  Additionally he will likely require BiPAP, Covid test, the patient is critically ill.  The first lactic acid is negative, 1.8, no signs of acute organ dysfunction other than the chronic respiratory failure.  There is no evidence signs of pneumonia, the patient is likely not septic but has a progressive process.  Further review of the medical record shows that the patient had a palliative care consult when he was in the hospital, he refused wanting to be admitted back to the hospital in the future, [facility] was contacted, we are having difficulty getting a hold of this home hospice company.  We will get social work involved.  I do not think that there is necessarily a reason to admit this patient to the hospital, his chronic respiratory failure is likely the driving force.  This may be the end of this patient's 10-year, I do not see any acutely reversible process.  He is oxygenating at 92% on supplemental oxygen at this time, no evidence of intracranial abnormalities, obvious infections etc.  I discussed the patient's care with the home hospice nurse, French Ana who is on-call for the weekend.  She does not personally know the patient but states  that she received a report from the nurse on the last couple of days who made a home visit yesterday, was given Ativan because he was anxious, he was refusing morphine, he was feeling more short of breath.  This home service only goes into the home, there is no inpatient unit, the patient is not able to be discharged due to safety issues, he will need to have more of a firm placement in an inpatient hospice unit for comfort care.  Social work helping at this time.  Contact number for French Ana with [facility] is [phone]  D/w Palliative care - they recommend inpatient admission and SW will work on placement.   D/w Dr. Katrinka Blazing - will admit   Final Clinical Impressions(s) / ED Diagnoses   Final diagnoses:  Acute on chronic respiratory failure with hypoxia and hypercapnia ([facility])      Eber Hong, MD 04/26/19 1352    Eber Hong, MD 04/26/19 1352

## 2019-04-26 NOTE — ED Notes (Signed)
Monitor removed, IV fluids stopped.

## 2019-04-26 NOTE — ED Notes (Signed)
Pt is awake, talking, able to answer questions.

## 2019-04-26 NOTE — ED Notes (Signed)
Mouth and throat suctions with yanker by this RN, pt tolerated well.

## 2019-04-26 NOTE — Care Management (Signed)
ED CM spoke with ED RN  concerning patient who was discharged with [facility] who does not provide residential hospice.  Patient is unresponsive and we are unaware of patient's end of life goals. CM contacted on-call nurse and is awaiting a return call.

## 2019-04-26 NOTE — Progress Notes (Signed)
Pharmacy Antibiotic Note  Nathan Cantu is a 73 y.o. male admitted on 04/26/2019 with pneumonia.  Pharmacy has been consulted for cefepime and vancomycin dosing.  Coming from [facility] - found on the floor, with hematoma on L side of forehead. CXR not convincing for PNA. WBC 18.1, LA 1.8. Scr 1.14 (CrCl 75 mL/min).   Plan: Cefepime 2 g IV every 8 hours  Vancomycin 2 g IV once then 1750 mg IV every 24 hours (estAUC 471) Monitor renal fx, cx results, clinical pic, and for s/sx of bleeding     No data recorded.  No results for input(s): WBC, CREATININE, LATICACIDVEN, VANCOTROUGH, VANCOPEAK, VANCORANDOM, GENTTROUGH, GENTPEAK, GENTRANDOM, TOBRATROUGH, TOBRAPEAK, TOBRARND, AMIKACINPEAK, AMIKACINTROU, AMIKACIN in the last 168 hours.  CrCl cannot be calculated (Unknown ideal weight.).    Not on File  Antimicrobials this admission: Vancomycin 10/31 >>  Cefepime 10/31 >>   Dose adjustments this admission: N/A  Microbiology results: 10/31 BCx: sent 10/31 UCx: sent  10/31 COVID: sent   Thank you for allowing pharmacy to be a part of this patient's care.  Antonietta Jewel, PharmD, BCCCP Clinical Pharmacist  Phone: [phone]  Please check AMION for all [facility] phone numbers After 10:00 PM, call [facility] [phone] 04/26/2019 9:11 AM

## 2019-04-26 NOTE — H&P (Addendum)
History and Physical    Nathan Cantu EAV:[id number] DOB: Nov 18, 1945 DOA: 04/26/2019  Referring MD/NP/PA: Eber Hong, MD PCP: Judd Lien, PA-C  Patient coming from: Independent living via EMS  Chief Complaint: Fall  I have personally briefly reviewed patient's old medical records in [facility]   HPI: Nathan Cantu is a 73 y.o. male with medical history significant of cerebellar stroke, COPD, tobacco abuse, diastolic CHF, severe PAD with known occlusion, AAA, and PE previously on Eliquis.  Patient presents after having a fall at home found to be acutely altered.    Patient was noted to have previously refused vascular surgery during previous admission at [facility] at the beginning of the month and was sent home with hospice on 04/04/2019.  He was then hospitalized here at [facility] from 10/15-10/17 for shortness of breath and left foot pain.  Found to have acute on chronic respiratory failure with hypoxia and hypercapnia requiring BiPAP, left leg cellulitis,  left SFA occlusion and multiple stable thoracoabdominal aneurysms largest being 7.4 cm.  During the hospitalization patient declined BiPAP and was set up with care to go home again with hospice.  Patient appears to have been discharged back to independent living with hospice nurse coming to visit instead of a inpatient hospice facility.  Once home patient reported had multiple falls with EMS called multiple times.  During one of the times EMS was called out Narcan had been given, but then the patient had refused transport.  After his most recent fall today he was noted to be less responsive with O2 saturations noted to be around 88% and EMS brought him to the hospital for further evaluation.  ED Course: Upon admission into the emergency department patient was noted to be afebrile, respiration 13-23, O2 saturations as low as 75% but improved to 100% on a nonrebreather.  Labs significant for WBC 18.1, lactic  acid 1.8, and ammonia 25.  ABG revealed pH 7.302, PCO2 66.6, and PO2 64.  Patient was initially noted to be very lethargic, but has become more arousable over the course of his stay in the emergency department.  Palliative care was consulted as patient was noted to be DNR/DNI with comfort care measures.  Comfort care orders were placed for the patient.  However, bed was not readily available at [facility] and [facility] called again to admit.  Review of systems: A complete 10 point review of systems was performed and negative except for as noted above in HPI.  Past Medical History:  Diagnosis Date  . (HFpEF) heart failure with preserved ejection fraction (HCC)   . Cerebellar stroke (HCC)   . Chronic hypercapnic respiratory failure (HCC)   . Chronic hypoxemic respiratory failure (HCC)   . Femoral artery occlusion (HCC)   . Pulmonary embolism (HCC)   . Thoracoabdominal aneurysm (HCC)   . Tobacco abuse     History reviewed. No pertinent surgical history.  Social history: Patient with history of tobacco use.  No reported alcohol or illicit drug use.   Family History  Family history unknown: Yes    Prior to Admission medications   Medication Sig Start Date End Date Taking? Authorizing Provider  Albuterol Sulfate 2.5 MG/0.5ML NEBU Inhale 2.5 mg into the lungs every 4 (four) hours as needed (shortness of breath/wheezing).     [provider]  amLODipine (NORVASC) 10 MG tablet Take 5 mg by mouth daily.     [provider]  apixaban (ELIQUIS) 5 MG TABS tablet  Take 5 mg by mouth 2 (two) times daily.    [provider]  arformoterol (BROVANA) 15 MCG/2ML NEBU Take 15 mcg by nebulization 2 (two) times daily. As directed    [provider]  budesonide (PULMICORT) 0.5 MG/2ML nebulizer solution Take 0.5 mg by nebulization 2 (two) times daily. As directed    [provider]  doxycycline (VIBRA-TABS) 100 MG tablet Take 1 tablet (100 mg total) by mouth 2 (two)  times daily. 04/12/19   Almon Hercules, MD  glycopyrrolate (ROBINUL) 2 MG tablet Take 1 tablet (2 mg total) by mouth 3 (three) times daily as needed (oversecretion). 04/12/19   Almon Hercules, MD  lidocaine (XYLOCAINE) 5 % ointment Apply 1 application topically See admin instructions. Apply topically to left foot three times daily as directed    [provider]  LORazepam (ATIVAN) 0.5 MG tablet Take 1 tablet (0.5 mg total) by mouth every 4 (four) hours as needed for anxiety. 04/12/19 04/11/20  Almon Hercules, MD  morphine (ROXANOL) 20 MG/ML concentrated solution Take 0.5 mLs (10 mg total) by mouth every 3 (three) hours as needed for severe pain, anxiety or shortness of breath. 04/12/19   Almon Hercules, MD  polyethylene glycol (MIRALAX / GLYCOLAX) 17 g packet Take 17 g by mouth daily.     [provider]  predniSONE (DELTASONE) 50 MG tablet Take 1 tablet (50 mg total) by mouth daily with breakfast. 04/12/19   Almon Hercules, MD    Physical Exam:  Constitutional: Elderly male who appears well awake and answers questions appropriately Vitals:   04/26/19 1300 04/26/19 1330 04/26/19 1400 04/26/19 1430  BP: 114/73 (!) 115/98 124/82 125/77  Pulse: 97 99 94 95  Resp: (!) 23 (!) TempSrc:      SpO2: 90% 93% 99% 97%  Weight:      Height:       Eyes: PERRL, lids and conjunctivae normal ENMT: Mucous membranes are dry. Posterior pharynx clear of any exudate or lesions.  Neck: normal, supple, no masses, no thyromegaly Respiratory: Diffuse bilateral wheezes and rhonchi noted in both lung fields.  Currently on O2 saturations maintained. Cardiovascular: Regular rate and rhythm, no murmurs / rubs / gallops. No extremity edema. 2+ pedal pulses. No carotid bruits.  Abdomen: no tenderness, no masses palpated. No hepatosplenomegaly. Bowel sounds positive.  Musculoskeletal: no clubbing / cyanosis. No joint deformity upper and lower extremities. Good ROM, no contractures. Normal muscle  tone.  Skin: Denuded left great toe with blood present.  Hematoma of the left forehead Neurologic: CN 2-12 grossly intact. Sensation intact, DTR normal. Strength 5/5 in all 4.  Psychiatric:Lethargic, but oriented x 3.  Disgruntled mood.     Labs on Admission: I have personally reviewed following labs and imaging studies  CBC: Recent Labs  Lab 04/26/19 0911 04/26/19 0917  WBC  --  18.1*  NEUTROABS  --  14.9*  HGB 16.0 15.7  HCT 47.0 49.5  MCV  --  97.1  PLT  --  219   Basic Metabolic Panel: Recent Labs  Lab 04/26/19 0911 04/26/19 0917  NA 134* 137  K 4.7 4.8  CL  --  96*  CO2  --  29  GLUCOSE  --  92  BUN  --  21  CREATININE  --  1.14  CALCIUM  --  9.0   GFR: Estimated Creatinine Clearance: 75.5 mL/min (by C-G formula based on SCr of 1.14 mg/dL). Liver Function  Tests: Recent Labs  Lab 04/26/19 0917  AST 23  ALT 15  ALKPHOS 66  BILITOT 0.7  PROT 6.1*  ALBUMIN 3.2*   No results for input(s): LIPASE, AMYLASE in the last 168 hours. Recent Labs  Lab 04/26/19 0921  AMMONIA 25   Coagulation Profile: Recent Labs  Lab 04/26/19 0917  INR 0.9   Cardiac Enzymes: No results for input(s): CKTOTAL, CKMB, CKMBINDEX, TROPONINI in the last 168 hours. BNP (last 3 results) No results for input(s): PROBNP in the last 8760 hours. HbA1C: No results for input(s): HGBA1C in the last 72 hours. CBG: No results for input(s): GLUCAP in the last 168 hours. Lipid Profile: No results for input(s): CHOL, HDL, LDLCALC, TRIG, CHOLHDL, LDLDIRECT in the last 72 hours. Thyroid Function Tests: No results for input(s): TSH, T4TOTAL, FREET4, T3FREE, THYROIDAB in the last 72 hours. Anemia Panel: No results for input(s): VITAMINB12, FOLATE, FERRITIN, TIBC, IRON, RETICCTPCT in the last 72 hours. Urine analysis:    Component Value Date/Time   COLORURINE YELLOW 04/26/2019 1050   APPEARANCEUR CLEAR 04/26/2019 1050   LABSPEC 1.013 04/26/2019 1050   PHURINE 7.0 04/26/2019 1050    GLUCOSEU NEGATIVE 04/26/2019 1050   HGBUR NEGATIVE 04/26/2019 1050   BILIRUBINUR NEGATIVE 04/26/2019 1050   KETONESUR NEGATIVE 04/26/2019 1050   PROTEINUR NEGATIVE 04/26/2019 1050   NITRITE NEGATIVE 04/26/2019 1050   LEUKOCYTESUR NEGATIVE 04/26/2019 1050   Sepsis Labs: Recent Results (from the past 240 hour(s))  SARS Coronavirus 2 by RT PCR (hospital order[facility]) Nasopharyngeal Nasopharyngeal Swab     Status: None   Collection Time: 04/26/19  9:17 AM   Specimen: Nasopharyngeal Swab  Result Value Ref Range Status   SARS Coronavirus 2 NEGATIVE NEGATIVE Final    Comment: (NOTE) If result is NEGATIVE SARS-CoV-2 target nucleic acids are NOT DETECTED. The SARS-CoV-2 RNA is generally detectable in upper and lower  respiratory specimens during the acute phase of infection. The lowest  concentration of SARS-CoV-2 viral copies this assay can detect is 250  copies / mL. A negative result does not preclude SARS-CoV-2 infection  and should not be used as the sole basis for treatment or other  patient management decisions.  A negative result may occur with  improper specimen collection / handling, submission of specimen other  than nasopharyngeal swab, presence of viral mutation(s) within the  areas targeted by this assay, and inadequate number of viral copies  (<250 copies / mL). A negative result must be combined with clinical  observations, patient history, and epidemiological information. If result is POSITIVE SARS-CoV-2 target nucleic acids are DETECTED. The SARS-CoV-2 RNA is generally detectable in upper and lower  respiratory specimens dur ing the acute phase of infection.  Positive  results are indicative of active infection with SARS-CoV-2.  Clinical  correlation with patient history and other diagnostic information is  necessary to determine patient infection status.  Positive results do  not rule out bacterial infection or co-infection with other  viruses. If result is PRESUMPTIVE POSTIVE SARS-CoV-2 nucleic acids MAY BE PRESENT.   A presumptive positive result was obtained on the submitted specimen  and confirmed on repeat testing.  While 2019 novel coronavirus  (SARS-CoV-2) nucleic acids may be present in the submitted sample  additional confirmatory testing may be necessary for epidemiological  and / or clinical management purposes  to differentiate between  SARS-CoV-2 and other Sarbecovirus currently known to infect humans.  If clinically indicated additional testing with an alternate test  methodology [id number]) is advised. The SARS-CoV-2 RNA is generally  detectable in upper and lower respiratory sp ecimens during the acute  phase of infection. The expected result is Negative. Fact Sheet for Patients:  BoilerBrush.com.cy Fact Sheet for Healthcare Providers: https://pope.com/ This test is not yet approved or cleared by the [place] FDA and has been authorized for detection and/or diagnosis of SARS-CoV-2 by FDA under an Emergency Use Authorization (EUA).  This EUA will remain in effect (meaning this test can be used) for the duration of the COVID-19 declaration under Section 564(b)(1) of the Act, 21 U.S.C. section 360bbb-3(b)(1), unless the authorization is terminated or revoked sooner. Performed at [facility], [address], [place], [place] [address]      Radiological Exams on Admission: Ct Head Wo Contrast  Result Date: 04/26/2019 CLINICAL DATA:  Left forehead hematoma following a fall onto the floor. Ataxia. EXAM: CT HEAD WITHOUT CONTRAST TECHNIQUE: Contiguous axial images were obtained from the base of the skull through the vertex without intravenous contrast. COMPARISON:  None. FINDINGS: Brain: There is some limitation due to patient motion. Old or subacute right cerebellar hemisphere infarcts. Minimally enlarged ventricles and subarachnoid spaces. No  intracranial hemorrhage or mass lesion. Vascular: No hyperdense vessel or unexpected calcification. Skull: Normal. Negative for fracture or focal lesion. Sinuses/Orbits: No acute finding. Other: Left frontal scalp hematoma. IMPRESSION: 1. Limited examination due to patient motion. 2. Old or subacute right cerebellar hemisphere infarcts. 3. Left frontal scalp hematoma without skull fracture or intracranial hemorrhage. 4. Minimal diffuse cerebral and cerebellar atrophy. Electronically Signed   By: Beckie Salts M.D.   On: 04/26/2019 10:05   Dg Chest Port 1 View  Result Date: 04/26/2019 CLINICAL DATA:  EMS was called for fall, pt found on floor- has hematoma to left side of forehead- snoring during exam. Pt unable to be awaken during exam. EXAM: PORTABLE CHEST 1 VIEW COMPARISON:  04/10/2019 and older exams FINDINGS: Cardiac silhouette mildly enlarged.  No mediastinal or hilar masses. Lung base opacities consistent with scarring or atelectasis. There are prominent bilateral bronchovascular markings, accentuated by low lung volumes. No convincing pneumonia and no evidence of pulmonary edema. No pleural effusion or pneumothorax. Skeletal structures are grossly intact. IMPRESSION: 1. No acute cardiopulmonary disease. Electronically Signed   By: Amie Portland M.D.   On: 04/26/2019 09:40    EKG: Independently reviewed.  Sinus rhythm at 87 bpm  Assessment/Plan Chronic aortic dissection/thoracoabdominal aneurysm Pulmonary embolus Cellulitis of the left toe Acute respiratory failure with hypercapnia and hypoxia  COPD exacerbation History of cerebellar CVA Chronic diastolic congestive heart failure Leukocytosis Fall, at home Comfort measures only status  Patient with multiple comorbidities including thoracic abdominal aneurysms, chronic respiratory failure with hypoxia and hypercapnia not tolerating BiPAP, left leg cellulitis,  left SFA occlusion.  Patient had been evaluated previously by vascular surgery  and critical care services during his last admission on 10/15-10/17, and was deemed a poor surgical candidate.  Furthermore, the patient had refused surgery similar problems listed above per review of records from [facility] hospitalization on 10/2-10/9 .  Palliative care was consulted and patient comfort care status was confirmed after talks with the patient's family.  Admission initially was deferred as there had seem to be bed availability at [facility].  However, a bed at [facility] will not be ready until possibly 11/2.  Comfort care orders are already placed by palliative care.  Social work working on bed placement.   DVT prophylaxis:  none  Code Status: DNR/DNI Family Communication: No family present at bedside Disposition Plan: Transfer to [facility] once bed available Consults called: Palliative care Admission status: Observation  Norval Morton MD [facility] Pager [phone]   If 7PM-7AM, please contact night-coverage [email or web address] Password [email or web address]  04/26/2019, 4:39 PM

## 2019-04-26 NOTE — ED Triage Notes (Signed)
To ED via [facility] from [place] [facility]-- EMS was called for fall, pt found on floor- has hematoma to left side of forehead- snoring resp, GCS 10

## 2019-04-26 NOTE — Progress Notes (Signed)
Epic note reviewed.  Discussed with Dr. Harvest Forest and Dr. Hazle Nordmann.  Talked with patient's sister Julio Alm [phone].  Patient is on Hosp[facility] Services with [facility] who does not have residential hospice facilities.  His goal is to have a comfortable peaceful passing.  His sister understands his goal and is supportive of her brother.  She wants him to have a peaceful comfortable passing.  (She is in [place] currently).  We talked about the possibility of transport to a local hospice facility as her brother did not ever want re-admission to the hospital.  She asked how long does he have? Is it less than 24 hours?  I explained to her that with comfort measures only his prognosis is less than 24 hours.    His sister felt that the patient would be further distressed by transport to a hospice facility and requested admission to the hospital for comfort measures only.   I discussed this with Dr. Sabra Heck and requested admission for comfort.  Orders placed for end of life comfort measures.   Full note to follow.  Florentina Jenny, PA-C Palliative Medicine Pager: [phone]

## 2019-04-26 NOTE — ED Notes (Signed)
Pt is awake- talking to hospice PA -

## 2019-04-26 NOTE — Progress Notes (Signed)
Manufacturing engineer [facility]) Hospital Liaison note.   Received request from Florentina Jenny, PA  for patient interest in [facility]. Chart reviewed and spoke with family to acknowledge referral.  Unfortunately [facility] is not able to offer a room today. Tentatively, [facility] will be able to offer a room on Monday 04/28/2019.   Please do not hesitate to call with questions.   Thank you,    Farrel Gordon, RN, [facility]   [place]    [place] are on AMION

## 2019-04-26 NOTE — ED Notes (Signed)
Pt does arouse with movement but no purposeful response- Dr. Sabra Heck notified-- NRB turned off-- O2 on 3l/m/[place]

## 2019-04-26 NOTE — Care Management (Signed)
ED CM spoke with EDP, concerning patient needing comfort care but [facility] not equip to provide the level of care needed residential hospice.   Palliative will need to be consulted for possible GIP for comfort care. If patient level of care should change a consult to [facility] may be needed.

## 2019-04-26 NOTE — Consult Note (Signed)
Consultation Note Date: 04/26/2019   Patient Name: Nathan Cantu  DOB: 21-Dec-1945  MRN: [id number]  Age / Sex: 73 y.o., male  PCP: Nathan Jews, PA-C Referring Physician: Norval Morton, MD  Reason for Consultation: Establishing goals of care and Psychosocial/spiritual support  HPI/Patient Profile:  73 y.o. male  with past medical history of COPD with continued tobacco use, severe PVD with SFA occlusion, CVA, D-CHF, and recent PE (hospitalized at [facility] 10/2 for acute PE) who presented to the ER on 04/26/2019 with hypercapnic respiratory failure and non-resolving cellulitis.  He currently lives in independent living and is on [facility].    Per the ER physician on exam the patient's oxygen saturations were in the 50s and he was unresponsive even to pain.  PMT was consulted by the ER physician after he had called [facility] and was told they did not have an EOL facility and thus they were unable offer support to the patient in his current state. .   Clinical Assessment and Goals of Care:  I have reviewed medical records including EPIC notes, labs and imaging, received report from the ER physician and the Hospitalist physician, assessed the patient and then met at the bedside along with the patient's sister Nathan Cantu by speakerphone  to discuss diagnosis prognosis, GOC, EOL wishes, disposition and options.  I met Nathan Cantu during his admission earlier this month.  When I saw him he was about to be discharged.  I asked about his goals and he stated "I'm dying and I would like to die quickly" and "I never ever want to come back to the hospital".    I visited him today in the ER.  To my surprise he was awake and talking.  He was frustrated, "What am I doing here?"  He did not want to return to the ER or be admitted to the hospital. After talking with his sister Nathan Cantu  several times and speaking with Nathan Cantu,   I explained to Nathan Cantu that he was too sick to be at independent living and if he did not want to be in the hospital he could go to [facility] for end of life care.  Initially this upset Nathan Cantu as well.  After we discussed it further with his sister on speaker phone he became agreeable to go to [facility] today as long as he never returned to the hospital.  "Can you guarantee me I will not return to the hospital?"  I took a calculated risk and gave him my word that he would not return to the hospital.  I then discussed disposition with his RN, and ER MD.  I left two voice mails for social work.  I put the Nathan Cantu representative in touch with the patient's sister.  Primary Decision Maker:  PATIENT and his sister Nathan Cantu    SUMMARY OF RECOMMENDATIONS     Placed comfort focused orders Order placed for social work to refer to [employer] Discussed with [place] [employer]  [facility] Liasion, Nathan Cantu DNR gold form completed.  Code Status/Advance Care Planning: DNR  Additional Recommendations (Limitations, Scope, Preferences):  Full Comfort Care  Palliative Prophylaxis:   Frequent Pain Assessment  Psycho-social/Spiritual:   Desire for further Chaplaincy support:  No thank you.  Prognosis:  Days due to end stage respiratory failure and severe peripheral vascular disease with recurrent infection in his lower extremity and SFA occlusion.    Discharge Planning: Hospice facility      Primary Diagnoses: Present on Admission: **None**   I have reviewed the medical record, interviewed the patient and family, and examined the patient. The following aspects are pertinent.  Past Medical History:  Diagnosis Date  . (HFpEF) heart failure with preserved ejection fraction (HCC)   . Cerebellar stroke (HCC)   . Chronic hypercapnic respiratory failure (HCC)   . Chronic hypoxemic respiratory failure (HCC)   . Femoral  artery occlusion (HCC)   . Pulmonary embolism (HCC)   . Thoracoabdominal aneurysm (HCC)   . Tobacco abuse    Social History   Socioeconomic History  . Marital status: Single    Spouse name: Not on file  . Number of children: Not on file  . Years of education: Not on file  . Highest education level: Not on file  Occupational History  . Not on file  Social Needs  . Financial resource strain: Not on file  . Food insecurity    Worry: Not on file    Inability: Not on file  . Transportation needs    Medical: Not on file    Non-medical: Not on file  Tobacco Use  . Smoking status: Not on file  Substance and Sexual Activity  . Alcohol use: Not on file  . Drug use: Not on file  . Sexual activity: Not on file  Lifestyle  . Physical activity    Days per week: Not on file    Minutes per session: Not on file  . Stress: Not on file  Relationships  . Social connections    Talks on phone: Not on file    Gets together: Not on file    Attends religious service: Not on file    Active member of club or organization: Not on file    Attends meetings of clubs or organizations: Not on file    Relationship status: Not on file  Other Topics Concern  . Not on file  Social History Narrative  . Not on file   Family History  Family history unknown: Yes   Scheduled Meds: . glycopyrrolate  0.4 mg Intravenous TID  . LORazepam  1 mg Intravenous Q6H  . sodium chloride flush  3 mL Intravenous Q12H   Continuous Infusions: . sodium chloride    . morphine     PRN Meds:.sodium chloride, acetaminophen **OR** acetaminophen, antiseptic oral rinse, glycopyrrolate **OR** glycopyrrolate **OR** glycopyrrolate, haloperidol **OR** haloperidol **OR** haloperidol lactate, morphine, ondansetron **OR** ondansetron (ZOFRAN) IV, polyvinyl alcohol, sodium chloride flush Not on File Review of Systems SOB.  To SOB to provide.  Physical Exam  Well developed male, awake, frustrated  CV tachy resp SOB on 15L  Abdomen obese, NT Lower with 2+ edema, RLE darkened and red  Vital Signs: BP 114/73   Pulse 97   Resp (!) 23   Ht 5' 10" (1.778 m)   Wt 115 kg   SpO2 90%   BMI 36.38 kg/m          SpO2: SpO2: 90 % O2 Device:SpO2: 90 %   O2 Flow Rate: .O2 Flow Rate (L/min): (S) 15 L/min  IO: Intake/output summary:   Intake/Output Summary (Last 24 hours) at 04/26/2019 1424 Last data filed at 04/26/2019 1333 Gross per 24 hour  Intake 500 ml  Output -  Net 500 ml    LBM:   Baseline Weight: Weight: 115 kg Most recent weight: Weight: 115 kg     Palliative Assessment/Data: 20%     Time In: 12:30 Time Out: 1:40 Time Total: 70 min. Visit consisted of counseling and education dealing with the complex and emotionally intense issues surrounding the need for palliative care and symptom management in the setting of serious and potentially life-threatening illness. Greater than 50%  of this time was spent counseling and coordinating care related to the above assessment and plan.  Signed by:  , PA-C Palliative Medicine Pager: [phone]  Please contact Palliative Medicine Team phone at [phone] for questions and concerns.  For individual provider: See Amion               

## 2019-04-26 NOTE — ED Notes (Signed)
ED TO INPATIENT HANDOFF REPORT  ED Nurse Name and Phone #: Bjorn LoserKevin [phone]  S Name/Age/Gender Nathan Cantu 73 y.o. male Room/Bed: RESUSC/RESUSC  Code Status   Code Status: DNR  Home/SNF/Other Home Patient oriented to: self Is this baseline? Yes   Triage Complete: Triage complete  Chief Complaint Altered  [facility]-- EMS was called for fall, pt found on floor- has hematoma to left side of forehead- snoring resp, GCS 10   Allergies Not on File  Level of Care/Admitting Diagnosis ED Disposition    ED Disposition Condition Comment   Admit  [facility] [[id number]]  Level of Care: Med-Surg [16]  I expect the patient will be discharged within 24 hours: No (not a candidate for 5C-Observation unit)  Covid Evaluation: Asymptomatic Screening Protocol (No Symptoms)  Diagnosis: Comfort measures only status [[id number]][[id number]]  Admitting Physician: Clydie BraunSMITH, RONDELL A [[id number]]  Attending Physician: Clydie BraunSMITH, RONDELL A [[id number]]  PT Class (Do Not Modify): Observation [104]  PT Acc Code (Do Not Modify): Observation [10022]       B Medical/Surgery History Past Medical History:  Diagnosis Date  . (HFpEF) heart failure with preserved ejection fraction (HCC)   . Cerebellar stroke (HCC)   . Chronic hypercapnic respiratory failure (HCC)   . Chronic hypoxemic respiratory failure (HCC)   . Femoral artery occlusion (HCC)   . Pulmonary embolism (HCC)   . Thoracoabdominal aneurysm (HCC)   . Tobacco abuse    History reviewed. No pertinent surgical history.   A IV Location/Drains/Wounds Patient Lines/Drains/Airways Status   Active Line/Drains/Airways    Name:   Placement date:   Placement time:   Site:   Days:   Peripheral IV 04/10/19 Right Antecubital   04/10/19    1441    Antecubital   16   Peripheral IV 04/10/19 Left Wrist   04/10/19    1441    Wrist   16   Peripheral IV 04/26/19 Right Antecubital    04/26/19    0850    Antecubital   less than 1   External Urinary Catheter   04/11/19    0225    -   15          Intake/Output Last 24 hours  Intake/Output Summary (Last 24 hours) at 04/26/2019 1655 Last data filed at 04/26/2019 1508 Gross per 24 hour  Intake 1000 ml  Output -  Net 1000 ml    Labs/Imaging Results for orders placed or performed during the hospital encounter of 04/26/19 (from the past 48 hour(s))  I-STAT 7, (LYTES, BLD GAS, ICA, H+H)     Status: Abnormal   Collection Time: 04/26/19  9:11 AM  Result Value Ref Range   pH, Arterial 7.302 (L) 7.350 - 7.450   pCO2 arterial 66.6 (HH) 32.0 - 48.0 mmHg   pO2, Arterial 64.0 (L) 83.0 - 108.0 mmHg   Bicarbonate 32.9 (H) 20.0 - 28.0 mmol/L   TCO2 35 (H) 22 - 32 mmol/L   O2 Saturation 89.0 %   Acid-Base Excess 4.0 (H) 0.0 - 2.0 mmol/L   Sodium 134 (L) 135 - 145 mmol/L   Potassium 4.7 3.5 - 5.1 mmol/L   Calcium, Ion 1.27 1.15 - 1.40 mmol/L   HCT 47.0 39.0 - 52.0 %   Hemoglobin 16.0 13.0 - 17.0 g/dL   Patient temperature HIDE    Sample type ARTERIAL    Comment NOTIFIED PHYSICIAN   SARS Coronavirus 2  by RT PCR (hospital order, performed in [facility] hospital lab) Nasopharyngeal Nasopharyngeal Swab     Status: None   Collection Time: 04/26/19  9:17 AM   Specimen: Nasopharyngeal Swab  Result Value Ref Range   SARS Coronavirus 2 NEGATIVE NEGATIVE    Comment: (NOTE) If result is NEGATIVE SARS-CoV-2 target nucleic acids are NOT DETECTED. The SARS-CoV-2 RNA is generally detectable in upper and lower  respiratory specimens during the acute phase of infection. The lowest  concentration of SARS-CoV-2 viral copies this assay can detect is 250  copies / mL. A negative result does not preclude SARS-CoV-2 infection  and should not be used as the sole basis for treatment or other  patient management decisions.  A negative result may occur with  improper specimen collection / handling, submission of specimen other  than  nasopharyngeal swab, presence of viral mutation(s) within the  areas targeted by this assay, and inadequate number of viral copies  (<250 copies / mL). A negative result must be combined with clinical  observations, patient history, and epidemiological information. If result is POSITIVE SARS-CoV-2 target nucleic acids are DETECTED. The SARS-CoV-2 RNA is generally detectable in upper and lower  respiratory specimens dur ing the acute phase of infection.  Positive  results are indicative of active infection with SARS-CoV-2.  Clinical  correlation with patient history and other diagnostic information is  necessary to determine patient infection status.  Positive results do  not rule out bacterial infection or co-infection with other viruses. If result is PRESUMPTIVE POSTIVE SARS-CoV-2 nucleic acids MAY BE PRESENT.   A presumptive positive result was obtained on the submitted specimen  and confirmed on repeat testing.  While 2019 novel coronavirus  (SARS-CoV-2) nucleic acids may be present in the submitted sample  additional confirmatory testing may be necessary for epidemiological  and / or clinical management purposes  to differentiate between  SARS-CoV-2 and other Sarbecovirus currently known to infect humans.  If clinically indicated additional testing with an alternate test  methodology [phone]) is advised. The SARS-CoV-2 RNA is generally  detectable in upper and lower respiratory sp ecimens during the acute  phase of infection. The expected result is Negative. Fact Sheet for Patients:  BoilerBrush.com.cy Fact Sheet for Healthcare Providers: https://pope.com/ This test is not yet approved or cleared by the [place] FDA and has been authorized for detection and/or diagnosis of SARS-CoV-2 by FDA under an Emergency Use Authorization (EUA).  This EUA will remain in effect (meaning this test can be used) for the duration of  the COVID-19 declaration under Section 564(b)(1) of the Act, 21 U.S.C. section 360bbb-3(b)(1), unless the authorization is terminated or revoked sooner. Performed at [facility], [address], [place], [place] [address]   Lactic acid, plasma     Status: None   Collection Time: 04/26/19  9:17 AM  Result Value Ref Range   Lactic Acid, Venous 1.8 0.5 - 1.9 mmol/L    Comment: Performed at [facility], [address], [place], [place] [address]  Comprehensive metabolic panel     Status: Abnormal   Collection Time: 04/26/19  9:17 AM  Result Value Ref Range   Sodium 137 135 - 145 mmol/L   Potassium 4.8 3.5 - 5.1 mmol/L   Chloride 96 (L) 98 - 111 mmol/L   CO2 29 22 - 32 mmol/L   Glucose, Bld 92 70 - 99 mg/dL   BUN 21 8 - 23 mg/dL   Creatinine, Ser 9.47 0.61 -  1.24 mg/dL   Calcium 9.0 8.9 - 50.5 mg/dL   Total Protein 6.1 (L) 6.5 - 8.1 g/dL   Albumin 3.2 (L) 3.5 - 5.0 g/dL   AST 23 15 - 41 U/L   ALT 15 0 - 44 U/L   Alkaline Phosphatase 66 38 - 126 U/L   Total Bilirubin 0.7 0.3 - 1.2 mg/dL   GFR calc non Af Amer >60 >60 mL/min   GFR calc Af Amer >60 >60 mL/min   Anion gap 12 5 - 15    Comment: Performed at [facility], [address], [place], [place] [address]  CBC WITH DIFFERENTIAL     Status: Abnormal   Collection Time: 04/26/19  9:17 AM  Result Value Ref Range   WBC 18.1 (H) 4.0 - 10.5 K/uL   RBC 5.10 4.22 - 5.81 MIL/uL   Hemoglobin 15.7 13.0 - 17.0 g/dL   HCT 34.1 93.7 - 90.2 %   MCV 97.1 80.0 - 100.0 fL   MCH 30.8 26.0 - 34.0 pg   MCHC 31.7 30.0 - 36.0 g/dL   RDW 40.9 (H) 73.5 - 32.9 %   Platelets 219 150 - 400 K/uL   nRBC 0.0 0.0 - 0.2 %   Neutrophils Relative % 83 %   Neutro Abs 14.9 (H) 1.7 - 7.7 K/uL   Lymphocytes Relative 10 %   Lymphs Abs 1.7 0.7 - 4.0 K/uL   Monocytes Relative 6 %   Monocytes Absolute 1.1 (H) 0.1 - 1.0 K/uL   Eosinophils Relative 0 %   Eosinophils Absolute 0.1 0.0 - 0.5 K/uL   Basophils Relative 0 %   Basophils Absolute  0.1 0.0 - 0.1 K/uL   Immature Granulocytes 1 %   Abs Immature Granulocytes 0.19 (H) 0.00 - 0.07 K/uL    Comment: Performed at [facility], [address], [place], [place] [address]  APTT     Status: None   Collection Time: 04/26/19  9:17 AM  Result Value Ref Range   aPTT 28 24 - 36 seconds    Comment: Performed at [facility], [address], [place], [place] [address]  Protime-INR     Status: None   Collection Time: 04/26/19  9:17 AM  Result Value Ref Range   Prothrombin Time 12.5 11.4 - 15.2 seconds   INR 0.9 0.8 - 1.2    Comment: (NOTE) INR goal varies based on device and disease states. Performed at [facility], [address] [place], [place] [address]   Ammonia     Status: None   Collection Time: 04/26/19  9:21 AM  Result Value Ref Range   Ammonia 25 9 - 35 umol/L    Comment: Performed at [facility], [address], [place], [place] [address]  Urinalysis, Routine w reflex microscopic     Status: None   Collection Time: 04/26/19 10:50 AM  Result Value Ref Range   Color, Urine YELLOW YELLOW   APPearance CLEAR CLEAR   Specific Gravity, Urine 1.013 1.005 - 1.030   pH 7.0 5.0 - 8.0   Glucose, UA NEGATIVE NEGATIVE mg/dL   Hgb urine dipstick NEGATIVE NEGATIVE   Bilirubin Urine NEGATIVE NEGATIVE   Ketones, ur NEGATIVE NEGATIVE mg/dL   Protein, ur NEGATIVE NEGATIVE mg/dL   Nitrite NEGATIVE NEGATIVE   Leukocytes,Ua NEGATIVE NEGATIVE    Comment: Performed at [facility], [address], [place], [place] [address]   Ct Head Wo Contrast  Result Date: 04/26/2019 CLINICAL DATA:  Left forehead hematoma following a fall onto the floor. Ataxia. EXAM: CT HEAD WITHOUT CONTRAST TECHNIQUE: Contiguous axial images were obtained from the base of the skull through the vertex without intravenous contrast. COMPARISON:  None. FINDINGS: Brain: There is some limitation due to patient motion. Old or subacute right cerebellar hemisphere infarcts. Minimally  enlarged ventricles and subarachnoid spaces. No intracranial hemorrhage or mass lesion. Vascular: No hyperdense vessel or unexpected calcification. Skull: Normal. Negative for fracture or focal lesion. Sinuses/Orbits: No acute finding. Other: Left frontal scalp hematoma. IMPRESSION: 1. Limited examination due to patient motion. 2. Old or subacute right cerebellar hemisphere infarcts. 3. Left frontal scalp hematoma without skull fracture or intracranial hemorrhage. 4. Minimal diffuse cerebral and cerebellar atrophy. Electronically Signed   By: Claudie Revering M.D.   On: 04/26/2019 10:05   Dg Chest Port 1 View  Result Date: 04/26/2019 CLINICAL DATA:  EMS was called for fall, pt found on floor- has hematoma to left side of forehead- snoring during exam. Pt unable to be awaken during exam. EXAM: PORTABLE CHEST 1 VIEW COMPARISON:  04/10/2019 and older exams FINDINGS: Cardiac silhouette mildly enlarged.  No mediastinal or hilar masses. Lung base opacities consistent with scarring or atelectasis. There are prominent bilateral bronchovascular markings, accentuated by low lung volumes. No convincing pneumonia and no evidence of pulmonary edema. No pleural effusion or pneumothorax. Skeletal structures are grossly intact. IMPRESSION: 1. No acute cardiopulmonary disease. Electronically Signed   By: Lajean Manes M.D.   On: 04/26/2019 09:40    Pending Labs Unresulted Labs (From admission, onward)   None      Vitals/Pain Today's Vitals   04/26/19 1300 04/26/19 1330 04/26/19 1400 04/26/19 1430  BP: 114/73 (!) 115/98 124/82 125/77  Pulse: 97 99 94 93  Resp: (!) 23 (!) 22 15 (!) 21  TempSrc:      SpO2: 90% 93% 99% 96%  Weight:      Height:        Isolation Precautions No active isolations  Medications Medications  acetaminophen (TYLENOL) tablet 650 mg (has no administration in time range)    Or  acetaminophen (TYLENOL) suppository 650 mg (has no administration in time range)  haloperidol (HALDOL)  tablet 0.5 mg (has no administration in time range)    Or  haloperidol (HALDOL) 2 MG/ML solution 0.5 mg (has no administration in time range)    Or  haloperidol lactate (HALDOL) injection 0.5 mg (has no administration in time range)  ondansetron (ZOFRAN-ODT) disintegrating tablet 4 mg (has no administration in time range)    Or  ondansetron (ZOFRAN) injection 4 mg (has no administration in time range)  glycopyrrolate (ROBINUL) tablet 1 mg (has no administration in time range)    Or  glycopyrrolate (ROBINUL) injection 0.2 mg (has no administration in time range)    Or  glycopyrrolate (ROBINUL) injection 0.2 mg (has no administration in time range)  antiseptic oral rinse (BIOTENE) solution 15 mL (has no administration in time range)  polyvinyl alcohol (LIQUIFILM TEARS) 1.4 % ophthalmic solution 1 drop (has no administration in time range)  sodium chloride flush (NS) 0.9 % injection 3 mL (has no administration in time range)  sodium chloride flush (NS) 0.9 % injection 3 mL (has no administration in time range)  0.9 %  sodium chloride infusion (has no administration in time range)  morphine bolus via infusion 4 mg (has no administration in time range)  morphine 100mg  in NS 123mL (1mg /mL) infusion - premix (  has no administration in time range)  glycopyrrolate (ROBINUL) injection 0.4 mg (has no administration in time range)  LORazepam (ATIVAN) injection 1 mg (has no administration in time range)  ceFEPIme (MAXIPIME) 2 g in sodium chloride 0.9 % 100 mL IVPB (0 g Intravenous Stopped 04/26/19 1150)  naloxone (NARCAN) injection 0.4 mg (0.4 mg Intravenous Given 04/26/19 1006)    Mobility non-ambulatory High fall risk   Focused Assessments Cardiac Assessment Handoff:  Cardiac Rhythm: Normal sinus rhythm No results found for: CKTOTAL, CKMB, CKMBINDEX, TROPONINI No results found for: DDIMER Does the Patient currently have chest pain? No   , Pulmonary Assessment Handoff:  Lung sounds:  Bilateral Breath Sounds: Rhonchi L Breath Sounds: Rhonchi R Breath Sounds: Rhonchi O2 Device: (S) NRB(changed from [place] to NRB) O2 Flow Rate (L/min): (S) 15 L/min      R Recommendations: See Admitting Provider Note  Report given to:   Additional Notes: see provider note, pt is palliative care and will be going to [facility] on monday

## 2019-04-27 DIAGNOSIS — L03032 Cellulitis of left toe: Secondary | ICD-10-CM | POA: Diagnosis present

## 2019-04-27 DIAGNOSIS — Z87891 Personal history of nicotine dependence: Secondary | ICD-10-CM | POA: Diagnosis not present

## 2019-04-27 DIAGNOSIS — I5030 Unspecified diastolic (congestive) heart failure: Secondary | ICD-10-CM | POA: Diagnosis present

## 2019-04-27 DIAGNOSIS — Z7901 Long term (current) use of anticoagulants: Secondary | ICD-10-CM | POA: Diagnosis not present

## 2019-04-27 DIAGNOSIS — Z86711 Personal history of pulmonary embolism: Secondary | ICD-10-CM | POA: Diagnosis not present

## 2019-04-27 DIAGNOSIS — L03116 Cellulitis of left lower limb: Secondary | ICD-10-CM | POA: Diagnosis present

## 2019-04-27 DIAGNOSIS — Z20828 Contact with and (suspected) exposure to other viral communicable diseases: Secondary | ICD-10-CM | POA: Diagnosis present

## 2019-04-27 DIAGNOSIS — I724 Aneurysm of artery of lower extremity: Secondary | ICD-10-CM | POA: Diagnosis present

## 2019-04-27 DIAGNOSIS — Z8673 Personal history of transient ischemic attack (TIA), and cerebral infarction without residual deficits: Secondary | ICD-10-CM | POA: Diagnosis not present

## 2019-04-27 DIAGNOSIS — Z79899 Other long term (current) drug therapy: Secondary | ICD-10-CM | POA: Diagnosis not present

## 2019-04-27 DIAGNOSIS — J441 Chronic obstructive pulmonary disease with (acute) exacerbation: Secondary | ICD-10-CM | POA: Diagnosis present

## 2019-04-27 DIAGNOSIS — I7100 Dissection of unspecified site of aorta: Secondary | ICD-10-CM | POA: Diagnosis present

## 2019-04-27 DIAGNOSIS — M160 Bilateral primary osteoarthritis of hip: Secondary | ICD-10-CM | POA: Diagnosis present

## 2019-04-27 DIAGNOSIS — Z515 Encounter for palliative care: Secondary | ICD-10-CM | POA: Diagnosis present

## 2019-04-27 DIAGNOSIS — I712 Thoracic aortic aneurysm, without rupture: Secondary | ICD-10-CM | POA: Diagnosis present

## 2019-04-27 DIAGNOSIS — I70202 Unspecified atherosclerosis of native arteries of extremities, left leg: Secondary | ICD-10-CM | POA: Diagnosis present

## 2019-04-27 DIAGNOSIS — J9622 Acute and chronic respiratory failure with hypercapnia: Secondary | ICD-10-CM | POA: Diagnosis present

## 2019-04-27 DIAGNOSIS — I716 Thoracoabdominal aortic aneurysm, without rupture: Secondary | ICD-10-CM | POA: Diagnosis present

## 2019-04-27 DIAGNOSIS — Z66 Do not resuscitate: Secondary | ICD-10-CM | POA: Diagnosis present

## 2019-04-27 DIAGNOSIS — J9621 Acute and chronic respiratory failure with hypoxia: Secondary | ICD-10-CM | POA: Diagnosis present

## 2019-04-27 LAB — URINE CULTURE

## 2019-04-27 MED ORDER — AMLODIPINE BESYLATE 5 MG PO TABS
5.0000 mg | ORAL_TABLET | Freq: Every day | ORAL | Status: DC
  Administered 2019-04-27 – 2019-04-28 (×2): 5 mg via ORAL
  Filled 2019-04-27 (×2): qty 1

## 2019-04-27 MED ORDER — HYDROMORPHONE HCL 1 MG/ML IJ SOLN
0.5000 mg | INTRAMUSCULAR | Status: DC | PRN
  Administered 2019-04-27: 0.5 mg via INTRAVENOUS
  Filled 2019-04-27: qty 1

## 2019-04-27 MED ORDER — LORAZEPAM 2 MG/ML IJ SOLN: 1.0000 mg | Freq: Two times a day (BID) | INTRAMUSCULAR | Status: DC

## 2019-04-27 MED ORDER — LIDOCAINE 5 % EX OINT
1.0000 "application " | TOPICAL_OINTMENT | Freq: Three times a day (TID) | CUTANEOUS | Status: DC | PRN
  Filled 2019-04-27: qty 35.44

## 2019-04-27 MED ORDER — POLYETHYLENE GLYCOL 3350 17 G PO PACK
17.0000 g | PACK | Freq: Every day | ORAL | Status: DC
  Administered 2019-04-27 – 2019-04-28 (×2): 17 g via ORAL
  Filled 2019-04-27 (×2): qty 1

## 2019-04-27 MED ORDER — LORAZEPAM 2 MG/ML IJ SOLN
1.0000 mg | Freq: Four times a day (QID) | INTRAMUSCULAR | Status: DC | PRN
  Administered 2019-04-27 – 2019-04-28 (×3): 1 mg via INTRAVENOUS
  Filled 2019-04-27 (×3): qty 1

## 2019-04-27 MED ORDER — APIXABAN 5 MG PO TABS
5.0000 mg | ORAL_TABLET | Freq: Two times a day (BID) | ORAL | Status: DC
  Administered 2019-04-27 – 2019-04-28 (×3): 5 mg via ORAL
  Filled 2019-04-27 (×3): qty 1

## 2019-04-27 MED ORDER — DOXYCYCLINE HYCLATE 100 MG PO TABS
100.0000 mg | ORAL_TABLET | Freq: Two times a day (BID) | ORAL | Status: DC
  Administered 2019-04-27 – 2019-04-28 (×3): 100 mg via ORAL
  Filled 2019-04-27 (×3): qty 1

## 2019-04-27 MED ORDER — ALBUTEROL SULFATE 2.5 MG/0.5ML IN NEBU: 2.5000 mg | INHALATION_SOLUTION | RESPIRATORY_TRACT | Status: DC | PRN

## 2019-04-27 MED ORDER — ARFORMOTEROL TARTRATE 15 MCG/2ML IN NEBU
15.0000 ug | INHALATION_SOLUTION | Freq: Two times a day (BID) | RESPIRATORY_TRACT | Status: DC
  Administered 2019-04-27 (×2): 15 ug via RESPIRATORY_TRACT
  Filled 2019-04-27 (×4): qty 2

## 2019-04-27 MED ORDER — BUDESONIDE 0.5 MG/2ML IN SUSP
0.5000 mg | Freq: Two times a day (BID) | RESPIRATORY_TRACT | Status: DC
  Administered 2019-04-27: 21:00:00 0.5 mg via RESPIRATORY_TRACT
  Filled 2019-04-27 (×3): qty 2

## 2019-04-27 MED ORDER — HALOPERIDOL LACTATE 5 MG/ML IJ SOLN: 5.0000 mg | Freq: Once | INTRAMUSCULAR | Status: DC

## 2019-04-27 MED ORDER — GLYCOPYRROLATE 1 MG PO TABS
2.0000 mg | ORAL_TABLET | Freq: Three times a day (TID) | ORAL | Status: DC | PRN
  Filled 2019-04-27: qty 2

## 2019-04-27 MED ORDER — ALBUTEROL SULFATE (2.5 MG/3ML) 0.083% IN NEBU
2.5000 mg | INHALATION_SOLUTION | RESPIRATORY_TRACT | Status: DC | PRN
  Administered 2019-04-27 (×2): 2.5 mg via RESPIRATORY_TRACT
  Filled 2019-04-27 (×2): qty 3

## 2019-04-27 MED ORDER — LORAZEPAM 0.5 MG PO TABS: 0.5000 mg | ORAL_TABLET | ORAL | Status: DC | PRN

## 2019-04-27 MED ORDER — PREDNISONE 50 MG PO TABS
50.0000 mg | ORAL_TABLET | Freq: Every day | ORAL | Status: DC
  Administered 2019-04-28: 50 mg via ORAL
  Filled 2019-04-27 (×2): qty 1

## 2019-04-27 MED ORDER — MORPHINE SULFATE (CONCENTRATE) 20 MG/ML PO SOLN: 10.0000 mg | ORAL | Status: DC | PRN

## 2019-04-27 NOTE — Progress Notes (Signed)
Manufacturing engineer [facility]) Hospital Liaison note.  Notified by Florentina Jenny, PA that patient may want to return to [facility] with 24 hour caregivers. His sister was assisting with this. It is not clear that [place] will allow him to come back.   He is still on the active list waiting for [facility]. Hospital Liaison will follow Monday morning.  Please do not hesitate to call with questions.   Thank you,    Farrel Gordon, RN, [facility]   [place]    [place] are on AMION

## 2019-04-27 NOTE — Evaluation (Signed)
Physical Therapy Evaluation Patient Details Name: Nathan Cantu MRN: [id number] DOB: 1945/07/12 Today's Date: 04/27/2019   History of Present Illness  73 y.o. male admitted on 04/26/19 for fall with hypoxia.  Pt dx with acute on chronic respiratory failure with hypoxia and hypercapnia, leukocytosis.  Being followed by hospice at his retirement home [facility]).  Pt with other significant PMH of  thoracoabdominal aneurysm, PE, fomoral artery occlusion, chronic respiratory failure, cerebellar stroke, HFpEF.   Clinical Impression  Pt willing to do whatever I ask of him if it will get him closer to going home to [facility].  He was able to transfer to the recliner chair set up to simulate his WC (on the correct side and in the same position his WC is normally).  He required min assist for stand pivot.  It sounds like the plan is to d/c home with 24 hour care from Hospice.  PT will follow acutely.   PT to follow acutely for deficits listed below.    Follow Up Recommendations No PT follow up;Other (comment);Supervision/Assistance - 24 hour(24 hour care from hospice at [facility] at d/c)    [place] bed    Recommendations for Other Services   NA    Precautions / Restrictions Precautions Precautions: Fall Precaution Comments: h/o falls      Mobility  Bed Mobility Overal bed mobility: Modified Independent             General bed mobility comments: Significant extra time and heavy reliance on the bedrail for support.   Transfers Overall transfer level: Needs assistance Equipment used: Rolling walker (2 wheeled) Transfers: Sit to/from [facility] Sit to Stand: Min assist Stand pivot transfers: Min assist       General transfer comment: Set up recliner chair on the side of the bed and where the pt reports he normally parks his WC at home.  He was able to stand and pivot to the chair with min assist and heavy reliance on  bed rails and armrests on chair during transfer.   Ambulation/Gait             General Gait Details: Per pt report he normally uses WC to get around the apartment using his legs and his arms to help.          Balance Overall balance assessment: Needs assistance Sitting-balance support: Feet supported;Bilateral upper extremity supported Sitting balance-Leahy Scale: Fair Sitting balance - Comments: close supervision EOB.  Reliant on UE support to get to EOB.                                      Pertinent Vitals/Pain Pain Assessment: Faces Faces Pain Scale: Hurts even more Pain Location: left foot Pain Descriptors / Indicators: Grimacing;Guarding Pain Intervention(s): Limited activity within patient's tolerance;Monitored during session;Repositioned    Home Living Family/patient expects to be discharged to:: Private residence([place] retirement apartment) Living Arrangements: Alone Available Help at Discharge: Other (Comment);Personal care attendant(hospice setting up 24/7 care) Type of Home: Apartment Home Access: Level entry     Home Layout: One level        Prior Function Level of Independence: Needs assistance   Gait / Transfers Assistance Needed: Per chart he can transfer to a WC (it did not indicate with or without assistance), but pt reports he can normally do it himself and says the fall is because he  had too much medication.               Extremity/Trunk Assessment   Upper Extremity Assessment Upper Extremity Assessment: Generalized weakness    Lower Extremity Assessment Lower Extremity Assessment: Generalized weakness    Cervical / Trunk Assessment Cervical / Trunk Assessment: Normal  Communication   Communication: No difficulties  Cognition Arousal/Alertness: Awake/alert Behavior During Therapy: WFL for tasks assessed/performed Overall Cognitive Status: No family/caregiver present to determine baseline cognitive  functioning                                 General Comments: Cognition not specifically tested, but poor safety awareness, reporting he could safely get back to the bed unassisted.       General Comments General comments (skin integrity, edema, etc.): O2 sats 81% on 4 L O2 [place] during session, HR 109 after transfer to chair.         Assessment/Plan    PT Assessment Patient needs continued PT services  PT Problem List Decreased strength;Decreased activity tolerance;Decreased balance;Decreased mobility;Decreased knowledge of use of DME;Decreased safety awareness;Decreased knowledge of precautions;Cardiopulmonary status limiting activity;Pain;Decreased skin integrity       PT Treatment Interventions DME instruction;Functional mobility training;Therapeutic activities;Therapeutic exercise;Balance training;Patient/family education;Wheelchair mobility training    PT Goals (Current goals can be found in the Care Plan section)  Acute Rehab PT Goals Patient Stated Goal: to go home as soon as he can PT Goal Formulation: With patient Time For Goal Achievement: 05/11/19 Potential to Achieve Goals: Fair    Frequency Min 3X/week           AM-PAC PT "6 Clicks" Mobility  Outcome Measure Help needed turning from your back to your side while in a flat bed without using bedrails?: A Little Help needed moving from lying on your back to sitting on the side of a flat bed without using bedrails?: A Little Help needed moving to and from a bed to a chair (including a wheelchair)?: A Little Help needed standing up from a chair using your arms (e.g., wheelchair or bedside chair)?: A Little Help needed to walk in hospital room?: Total Help needed climbing 3-5 steps with a railing? : Total 6 Click Score: 14    End of Session Equipment Utilized During Treatment: Oxygen(4L O2 [place]) Activity Tolerance: Patient limited by pain;Patient limited by fatigue Patient left: in chair;with call  bell/phone within reach;with chair alarm set Nurse Communication: Mobility status PT Visit Diagnosis: Muscle weakness (generalized) (M62.81);Difficulty in walking, not elsewhere classified (R26.2);Pain Pain - Right/Left: Left Pain - part of body: Ankle and joints of foot    Time: 1440-1502 PT Time Calculation (min) (ACUTE ONLY): 22 min   Charges:       Lurena Joiner B. Jabin Tapp, PT, DPT  Acute Rehabilitation [phone] pager [phone]) [phone] office  @ [place] [place]: [phone]   PT Evaluation $PT Eval Moderate Complexity: 1 Mod         04/27/2019, 3:16 PM

## 2019-04-27 NOTE — Progress Notes (Signed)
PROGRESS NOTE    Nathan Cantu  JOI:[id number] DOB: 1945-10-04 DOA: 04/26/2019 PCP: Curt Jews, PA-C   Brief Narrative:  Nathan Cantu is a 73 y.o. male with medical history significant of cerebellar stroke, COPD, tobacco abuse, diastolic CHF, severe PAD with known occlusion, AAA, and PE previously on Eliquis.  Patient presents after having a fall at home found to be acutely altered.    Patient was noted to have previously refused vascular surgery during previous admission at [facility] at the beginning of the month and was sent home with hospice on 04/04/2019.  He was then hospitalized here at [facility] from 10/15-10/17 for shortness of breath and left foot pain.  Found to have acute on chronic respiratory failure with hypoxia and hypercapnia requiring BiPAP, left leg cellulitis,  left SFA occlusion and multiple stable thoracoabdominal aneurysms largest being 7.4 cm.  During the hospitalization patient declined BiPAP and was set up with care to go home again with hospice.  Patient appears to have been discharged back to independent living with hospice nurse coming to visit instead of a inpatient hospice facility.  Once home patient reported had multiple falls with EMS called multiple times.  During one of the times EMS was called out Narcan had been given, but then the patient had refused transport.  After his most recent fall today he was noted to be less responsive with O2 saturations noted to be around 88% and EMS brought him to the hospital for further evaluation.  ED Course: Upon admission into the emergency department patient was noted to be afebrile, respiration 13-23, O2 saturations as low as 75% but improved to 100% on a nonrebreather.  Labs significant for WBC 18.1, lactic acid 1.8, and ammonia 25.  ABG revealed pH 7.302, PCO2 66.6, and PO2 64.  Patient was initially noted to be very lethargic, but has become more arousable over the course of his stay in the  emergency department.  Palliative care was consulted as patient was noted to be DNR/DNI with comfort care measures.  Comfort care orders were placed for the patient.  However, bed was not readily available at [facility] and [facility] called again to admit.  Assessment & Plan:   Principal Problem:   Comfort measures only status Active Problems:   Acute on chronic respiratory failure with hypoxia and hypercapnia (HCC)   Leukocytosis   Fall at home, initial encounter  Patient remains comfort care.  He is lethargic.  Seen by palliative care.  Comfort care orders placed.  Plan for the sister to arrange 24-hour care and possibly discharge to facility tomorrow.  DVT prophylaxis: None Code Status: DNR Family Communication: None present at bedside.  Discussed with palliative care nurse practitioner who had already discussed plans with patient's sister who lives in [place].  Sister is going to arrange 24-hour care and plan will be to discharge him to hospice tomorrow at facility. Disposition Plan: Discharge tomorrow to nursing home  Estimated body mass index is 36.38 kg/m as calculated from the following:   Height as of this encounter: 5\' 10"  (1.778 m).   Weight as of this encounter: 115 kg.      Nutritional status:               Consultants:   None  Procedures:   None  Antimicrobials:   None   Subjective: Seen and examined.  Patient was lethargic.  Mild tachypnea.  Objective: Vitals:   04/26/19 1400 04/26/19 1430 04/26/19 1659  04/27/19 0548  BP: 124/82 125/77 130/87 125/74  Pulse: 94 93 95 (!) 109  Resp: 15 (!) 21 20   Temp:    98.4 F (36.9 C)  TempSrc:    Oral  SpO2: 99% 96% 92% 94%  Weight:      Height:        Intake/Output Summary (Last 24 hours) at 04/27/2019 1302 Last data filed at 04/27/2019 0947 Gross per 24 hour  Intake 1380 ml  Output 650 ml  Net 730 ml   Filed Weights   04/26/19 0911 04/26/19 1020  Weight: 115 kg 115 kg     Examination:  General exam: Appears lethargic and slightly tachypneic Respiratory system: Tachypnea with diminished breath sounds but no rhonchi or crackles. Respiratory effort normal. Cardiovascular system: S1 & S2 heard, RRR. No JVD, murmurs, rubs, gallops or clicks. No pedal edema. Gastrointestinal system: Abdomen is nondistended, soft and nontender. No organomegaly or masses felt. Normal bowel sounds heard. Central nervous system: Lethargic Skin: No rashes, lesions or ulcers Psychiatry: Lethargic   Data Reviewed: I have personally reviewed following labs and imaging studies  CBC: Recent Labs  Lab 04/26/19 0911 04/26/19 0917  WBC  --  18.1*  NEUTROABS  --  14.9*  HGB 16.0 15.7  HCT 47.0 49.5  MCV  --  97.1  PLT  --  219   Basic Metabolic Panel: Recent Labs  Lab 04/26/19 0911 04/26/19 0917  NA 134* 137  K 4.7 4.8  CL  --  96*  CO2  --  29  GLUCOSE  --  92  BUN  --  21  CREATININE  --  1.14  CALCIUM  --  9.0   GFR: Estimated Creatinine Clearance: 75.5 mL/min (by C-G formula based on SCr of 1.14 mg/dL). Liver Function Tests: Recent Labs  Lab 04/26/19 0917  AST 23  ALT 15  ALKPHOS 66  BILITOT 0.7  PROT 6.1*  ALBUMIN 3.2*   No results for input(s): LIPASE, AMYLASE in the last 168 hours. Recent Labs  Lab 04/26/19 0921  AMMONIA 25   Coagulation Profile: Recent Labs  Lab 04/26/19 0917  INR 0.9   Cardiac Enzymes: No results for input(s): CKTOTAL, CKMB, CKMBINDEX, TROPONINI in the last 168 hours. BNP (last 3 results) No results for input(s): PROBNP in the last 8760 hours. HbA1C: No results for input(s): HGBA1C in the last 72 hours. CBG: No results for input(s): GLUCAP in the last 168 hours. Lipid Profile: No results for input(s): CHOL, HDL, LDLCALC, TRIG, CHOLHDL, LDLDIRECT in the last 72 hours. Thyroid Function Tests: No results for input(s): TSH, T4TOTAL, FREET4, T3FREE, THYROIDAB in the last 72 hours. Anemia Panel: No results for  input(s): VITAMINB12, FOLATE, FERRITIN, TIBC, IRON, RETICCTPCT in the last 72 hours. Sepsis Labs: Recent Labs  Lab 04/26/19 [id number]  LATICACIDVEN 1.8    Recent Results (from the past 240 hour(s))  SARS Coronavirus 2 by RT PCR (hospital order, [facility]) Nasopharyngeal Nasopharyngeal Swab     Status: None   Collection Time: 04/26/19  9:17 AM   Specimen: Nasopharyngeal Swab  Result Value Ref Range Status   SARS Coronavirus 2 NEGATIVE NEGATIVE Final    Comment: (NOTE) If result is NEGATIVE SARS-CoV-2 target nucleic acids are NOT DETECTED. The SARS-CoV-2 RNA is generally detectable in upper and lower  respiratory specimens during the acute phase of infection. The lowest  concentration of SARS-CoV-2 viral copies this assay can detect is 250  copies / mL. A  negative result does not preclude SARS-CoV-2 infection  and should not be used as the sole basis for treatment or other  patient management decisions.  A negative result may occur with  improper specimen collection / handling, submission of specimen other  than nasopharyngeal swab, presence of viral mutation(s) within the  areas targeted by this assay, and inadequate number of viral copies  (<250 copies / mL). A negative result must be combined with clinical  observations, patient history, and epidemiological information. If result is POSITIVE SARS-CoV-2 target nucleic acids are DETECTED. The SARS-CoV-2 RNA is generally detectable in upper and lower  respiratory specimens dur ing the acute phase of infection.  Positive  results are indicative of active infection with SARS-CoV-2.  Clinical  correlation with patient history and other diagnostic information is  necessary to determine patient infection status.  Positive results do  not rule out bacterial infection or co-infection with other viruses. If result is PRESUMPTIVE POSTIVE SARS-CoV-2 nucleic acids MAY BE PRESENT.   A presumptive positive result was  obtained on the submitted specimen  and confirmed on repeat testing.  While 2019 novel coronavirus  (SARS-CoV-2) nucleic acids may be present in the submitted sample  additional confirmatory testing may be necessary for epidemiological  and / or clinical management purposes  to differentiate between  SARS-CoV-2 and other Sarbecovirus currently known to infect humans.  If clinically indicated additional testing with an alternate test  methodology [phone]) is advised. The SARS-CoV-2 RNA is generally  detectable in upper and lower respiratory sp ecimens during the acute  phase of infection. The expected result is Negative. Fact Sheet for Patients:  BoilerBrush.com.cy Fact Sheet for Healthcare Providers: https://pope.com/ This test is not yet approved or cleared by the [place] FDA and has been authorized for detection and/or diagnosis of SARS-CoV-2 by FDA under an Emergency Use Authorization (EUA).  This EUA will remain in effect (meaning this test can be used) for the duration of the COVID-19 declaration under Section 564(b)(1) of the Act, 21 U.S.C. section 360bbb-3(b)(1), unless the authorization is terminated or revoked sooner. Performed at [facility], [address], [place], [place] [address]   Blood Culture (routine x 2)     Status: None (Preliminary result)   Collection Time: 04/26/19  9:38 AM   Specimen: BLOOD RIGHT FOREARM  Result Value Ref Range Status   Specimen Description BLOOD RIGHT FOREARM  Final   Special Requests   Final    BOTTLES DRAWN AEROBIC AND ANAEROBIC Blood Culture results may not be optimal due to an inadequate volume of blood received in culture bottles   Culture   Final    NO GROWTH < 24 HOURS Performed at [facility], [address], [place], [place] [address]    Report Status PENDING  Incomplete  Blood Culture (routine x 2)     Status: None (Preliminary result)   Collection Time:  04/26/19  9:38 AM   Specimen: BLOOD LEFT HAND  Result Value Ref Range Status   Specimen Description BLOOD LEFT HAND  Final   Special Requests   Final    BOTTLES DRAWN AEROBIC AND ANAEROBIC Blood Culture adequate volume   Culture   Final    NO GROWTH < 24 HOURS Performed at [facility], [address], [place], [place] [address]    Report Status PENDING  Incomplete  Urine culture     Status: Abnormal   Collection Time: 04/26/19 10:56 AM   Specimen: In/Out  Cath Urine  Result Value Ref Range Status   Specimen Description IN/OUT CATH URINE  Final   Special Requests   Final    NONE Performed at [facility], [address] [place], [place] [address]    Culture MULTIPLE SPECIES PRESENT, SUGGEST RECOLLECTION (A)  Final   Report Status 04/27/2019 FINAL  Final      Radiology Studies: Ct Head Wo Contrast  Result Date: 04/26/2019 CLINICAL DATA:  Left forehead hematoma following a fall onto the floor. Ataxia. EXAM: CT HEAD WITHOUT CONTRAST TECHNIQUE: Contiguous axial images were obtained from the base of the skull through the vertex without intravenous contrast. COMPARISON:  None. FINDINGS: Brain: There is some limitation due to patient motion. Old or subacute right cerebellar hemisphere infarcts. Minimally enlarged ventricles and subarachnoid spaces. No intracranial hemorrhage or mass lesion. Vascular: No hyperdense vessel or unexpected calcification. Skull: Normal. Negative for fracture or focal lesion. Sinuses/Orbits: No acute finding. Other: Left frontal scalp hematoma. IMPRESSION: 1. Limited examination due to patient motion. 2. Old or subacute right cerebellar hemisphere infarcts. 3. Left frontal scalp hematoma without skull fracture or intracranial hemorrhage. 4. Minimal diffuse cerebral and cerebellar atrophy. Electronically Signed   By: Beckie Salts M.D.   On: 04/26/2019 10:05   Dg Chest Port 1 View  Result Date: 04/26/2019 CLINICAL DATA:  EMS was called for fall, pt  found on floor- has hematoma to left side of forehead- snoring during exam. Pt unable to be awaken during exam. EXAM: PORTABLE CHEST 1 VIEW COMPARISON:  04/10/2019 and older exams FINDINGS: Cardiac silhouette mildly enlarged.  No mediastinal or hilar masses. Lung base opacities consistent with scarring or atelectasis. There are prominent bilateral bronchovascular markings, accentuated by low lung volumes. No convincing pneumonia and no evidence of pulmonary edema. No pleural effusion or pneumothorax. Skeletal structures are grossly intact. IMPRESSION: 1. No acute cardiopulmonary disease. Electronically Signed   By: Amie Portland M.D.   On: 04/26/2019 09:40    Scheduled Meds:  amLODipine  5 mg Oral Daily   apixaban  5 mg Oral BID   arformoterol  15 mcg Nebulization BID   budesonide  0.5 mg Nebulization BID   doxycycline  100 mg Oral BID   glycopyrrolate  0.4 mg Intravenous TID   haloperidol lactate  5 mg Intravenous Once   polyethylene glycol  17 g Oral Daily   [START ON 04/28/2019] predniSONE  50 mg Oral Q breakfast   sodium chloride flush  3 mL Intravenous Q12H   Continuous Infusions:  sodium chloride       LOS: 0 days   Time spent: 29 minutes   Hughie Closs, MD [facility]  04/27/2019, 1:02 PM   To contact the attending provider between 7A-7P or the covering provider during after hours 7P-7A, please log into the web site [email or web address] and use password [email or web address].

## 2019-04-27 NOTE — Progress Notes (Signed)
Daily Progress Note   Patient Name: Nathan Cantu       Date: 04/27/2019 DOB: 06-02-1946  Age: 73 y.o. MRN#: [id number] Attending Physician: Darliss Cheney, MD Primary Care Physician: Joella Prince Admit Date: 04/26/2019  Reason for Consultation/Follow-up: Establishing goals of care and Psychosocial/spiritual support  Chart reviewed.   Discussed with RN Spoke with Sister, Prentiss Bells  Subjective: Met patient at bedside.  He has eaten his entire breakfast.  He is insistent that he return to [place].  He explains that he transfers from bed to wheel chair normally and does not walk.  He also tells me he fell 3x recently and believes it was because of the medications he was prescribed.  After speaking with Prentiss Bells and explaining his wishes Prentiss Bells is agreeable to hire 24 hour care for him at [facility].  [facility] will be on board.  We discussed calling [facility] rather than 911 if he has problems.  Both Prentiss Bells and Gershon Mussel are in 100% agreement with that plan.  Tom DOES NOT want to return to the Hospital ever.   His plan is to reside at [facility] and die there.   Assessment: Patient appears stable.  Coherent.  Insistent about going home.   Patient Profile/HPI:  tobacco use, severe PVD with SFA occlusion, CVA, D-CHF, and recent PE (hospitalized at [facility] 10/2 for acute PE)who presented to the ER on 10/31/2020with hypercapnic respiratory failure and non-resolving cellulitis.  He currently lives in independent living and was on [facility].   Per the ER physician on exam the patient's oxygen saturations were in the 50s and he was unresponsive even to pain. PMT was consulted by the ER physician after he had called [facility] and was  told they did not have an EOL facility and thus they were unable offer support to the patient in his current state. .  Length of Stay: 0  Current Medications: Scheduled Meds:  . amLODipine  5 mg Oral Daily  . apixaban  5 mg Oral BID  . arformoterol  15 mcg Nebulization BID  . budesonide  0.5 mg Nebulization BID  . doxycycline  100 mg Oral BID  . glycopyrrolate  0.4 mg Intravenous TID  . lidocaine  1 application Topical See admin instructions  . polyethylene glycol  17 g Oral Daily  . [START ON 04/28/2019] predniSONE  50 mg Oral Q breakfast  . sodium chloride flush  3 mL Intravenous Q12H    Continuous Infusions: . sodium chloride      PRN Meds: sodium chloride, acetaminophen **OR** acetaminophen, Albuterol Sulfate, antiseptic oral rinse, glycopyrrolate, haloperidol **OR** haloperidol **OR** haloperidol lactate, HYDROmorphone (DILAUDID) injection, LORazepam, LORazepam, ondansetron **OR** ondansetron (ZOFRAN) IV, polyvinyl alcohol, sodium chloride flush  Physical Exam        Chronically ill appearing man, awake, alert, irascible. CV tachy resp no distress on 4L Abdomen obese Lower extremities 1-2 + Edema and redness.  Vital Signs: BP 125/74 (BP Location: Right Arm)   Pulse (!) 109   Temp 98.4 F (36.9 C) (Oral)   Resp 20   Ht _0  (1.778 m)   Wt 115 kg   SpO2 94%   BMI 36.38 kg/m  SpO2: SpO2: 94 % O2 Device: O2 Device: Nasal Cannula O2 Flow Rate: O2 Flow Rate (L/min): 4 L/min  Intake/output summary:   Intake/Output Summary (Last 24 hours) at 04/27/2019 0935 Last data filed at 04/27/2019 0630 Gross per 24 hour  Intake 1240 ml  Output 650 ml  Net 590 ml   LBM:   Baseline Weight: Weight: 115 kg Most recent weight: Weight: 115 kg       Palliative Assessment/Data: 30%      Patient Active Problem List   Diagnosis Date Noted  . Comfort measures only status 04/26/2019  . Leukocytosis 04/26/2019  . Fall at home, initial encounter 04/26/2019  . Acute on  chronic respiratory failure with hypoxia and hypercapnia (HCC)   . Palliative care encounter   . Acute respiratory failure with hypercapnia ([place]) 04/10/2019  . Pulmonary embolism ([place]) 04/10/2019  . COPD (chronic obstructive pulmonary disease) ([place]) 04/10/2019  . PVD (peripheral vascular disease) ([place]) 04/10/2019  . Tobacco abuse 04/10/2019  . Cellulitis 04/10/2019  . AKI (acute kidney injury) ([place]) 04/10/2019  . Hypomagnesemia 04/10/2019    Palliative Care Plan    Recommendations/Plan:  Will ask for PT evaluation.  If he can transfer to wheel chair then we will plan for him to return to [place] with 24 hour care and Hospice.  It is important that [place] understand no to call 911 but rather to call [place] to support Mr. Mendizabal as he does not want to return to the hospital.  Will complete MOST form.  Sister on board with hiring 24 hour care.  Goals of Care and Additional Recommendations:  Limitations on Scope of Treatment: Full Comfort Care  Code Status:  DNR  Prognosis:  Likely weeks to possibly months but he is at extremely high risk of decompensation and death due to respiratory failure or overwhelming infection.  Discharge Planning:  To Be Determined  Care plan was discussed with Sister and RN.  Thank you for allowing the Palliative Medicine Team to assist in the care of this patient.  Total time spent:  35 min.     Greater than 50%  of this time was spent counseling and coordinating care related to the above assessment and plan.  Florentina Jenny, PA-C Palliative Medicine  Please contact Palliative MedicineTeam phone at [phone] for questions and concerns between 7 am - 7 pm.   Please see AMION for individual provider pager numbers.

## 2019-04-28 MED ORDER — BUDESONIDE 0.5 MG/2ML IN SUSP: 0.5000 mg | Freq: Two times a day (BID) | RESPIRATORY_TRACT | Status: DC

## 2019-04-28 MED ORDER — BUDESONIDE 0.5 MG/2ML IN SUSP
0.5000 mg | Freq: Two times a day (BID) | RESPIRATORY_TRACT | Status: DC
  Administered 2019-04-28: 0.5 mg via RESPIRATORY_TRACT

## 2019-04-28 MED ORDER — ARFORMOTEROL TARTRATE 15 MCG/2ML IN NEBU
15.0000 ug | INHALATION_SOLUTION | Freq: Two times a day (BID) | RESPIRATORY_TRACT | Status: DC
  Administered 2019-04-28: 15 ug via RESPIRATORY_TRACT

## 2019-04-28 MED ORDER — ARFORMOTEROL TARTRATE 15 MCG/2ML IN NEBU: 15.0000 ug | INHALATION_SOLUTION | Freq: Two times a day (BID) | RESPIRATORY_TRACT | Status: DC

## 2019-04-28 NOTE — Progress Notes (Signed)
Per sister, Nathan Cantu, they have found 24 hour care for Nathan Cantu at [facility].    Hospice services to be arranged at Nathan [facility] home.  Anticipate discharge hopefully this afternoon.  Full note to follow.  Nathan Jenny, PA-C Palliative Medicine

## 2019-04-28 NOTE — Discharge Summary (Signed)
Physician Discharge Summary  Mateo Overbeck VHQ:[id number] DOB: [birth year] DOA: 04/26/2019  PCP: Judd Lien, PA-C  Admit date: 04/26/2019 Discharge date: 04/28/2019  Admitted From: Home Disposition: Home with 24-hour care  Recommendations for Outpatient Follow-up:  1. Follow up with PCP in 1-2 weeks 2. Please obtain BMP/CBC in one week 3. Please follow up on the following pending results:  Home Health: None Equipment/Devices: None  Discharge Condition: Fair CODE STATUS: DNR Diet recommendation: Regular  Subjective: Seen and examined.  Slightly lethargic.  On oxygen but denies any shortness of breath.   Brief/Interim Summary: Emad Brechtel a 73 y.o.malewith medical history significant ofcerebellar stroke, COPD, tobacco abuse, diastolic CHF, severe PAD with known occlusion, AAA, and PE previously on Eliquis. Patient presented after having a fall at home found to be acutely altered.   Patient was noted to have previously refused vascular surgery during previous admission at [facility] at the beginning of the month and was sent home with hospice on 04/04/2019. He was then hospitalized here at [facility] from 10/15-10/17 for shortness of breath and left foot pain. Found to have acute on chronic respiratory failure with hypoxia and hypercapnia requiring BiPAP, left leg cellulitis,left SFA occlusion and multiple stable thoracoabdominal aneurysmslargest being 7.4 cm.During the hospitalization patient declined BiPAP and was set up with care to go home again with hospice.  Patient appears to have been discharged back to independent living with hospice nurse coming to visitinstead of a inpatient hospice facility. Once home patient reported had multiple falls with EMS called multiple times. During one of the times EMS was called out Narcan had been given, but then the patient had refused transport. After his most recent fall today he was noted to beless  responsive with O2 saturations noted to be around 88% andEMS brought him to the hospital for further evaluation.  ED Course:Upon admission into the emergency department patient was noted to beafebrile, respiration 13-23,O2 saturations as low as 75% but improved to 100% on anonrebreather.Labs significant for WBC 18.1, lactic acid 1.8, and ammonia 25. ABG revealed pH 7.302, PCO2 66.6, and PO2 64. Patient was initially noted to be very lethargic, but has become more arousable over the course of his stay in the emergency department. Palliative care was consulted as patient was noted to be DNR/DNI with comfort care measures. Comfort care orders were placed for the patient. However, bed was not readily available at [facility] and [facility] called again to admit.  Palliative care had a long discussion with patient's sister who lives out of state.  She arranged 24-hour care for the patient to go back to his own apartment.  Patient also felt MOST form and did not want to return to the hospital for anything.  Patient's hospice agency was changed as well.  Patient is going to be discharged today.  Discharge Diagnoses:  Principal Problem:   Comfort measures only status Active Problems:   Acute on chronic respiratory failure with hypoxia and hypercapnia (HCC)   Leukocytosis   Fall at home, initial encounter    Discharge Instructions  Discharge Instructions    Discharge patient   Complete by: As directed    Discharge disposition: 01-Home or Self Care   Discharge patient date: 04/28/2019     Allergies as of 04/28/2019   Not on File     Medication List    TAKE these medications   Albuterol Sulfate 2.5 MG/0.5ML Nebu Inhale 2.5 mg into the lungs every 4 (four) hours as  needed (shortness of breath/wheezing).   arformoterol 15 MCG/2ML Nebu Commonly known as: BROVANA Take 15 mcg by nebulization 2 (two) times daily. As directed   budesonide 0.5 MG/2ML nebulizer solution Commonly known as:  PULMICORT Take 0.5 mg by nebulization 2 (two) times daily. As directed   doxycycline 100 MG tablet Commonly known as: VIBRA-TABS Take 1 tablet (100 mg total) by mouth 2 (two) times daily.   Eliquis 5 MG Tabs tablet Generic drug: apixaban Take 5 mg by mouth 2 (two) times daily.   glycopyrrolate 2 MG tablet Commonly known as: ROBINUL Take 1 tablet (2 mg total) by mouth 3 (three) times daily as needed (oversecretion).   lidocaine 5 % ointment Commonly known as: XYLOCAINE Apply 1 application topically See admin instructions. Apply topically to left foot three times daily as directed   LORazepam 0.5 MG tablet Commonly known as: Ativan Take 1 tablet (0.5 mg total) by mouth every 4 (four) hours as needed for anxiety.   morphine 20 MG/ML concentrated solution Commonly known as: ROXANOL Take 0.5 mLs (10 mg total) by mouth every 3 (three) hours as needed for severe pain, anxiety or shortness of breath.   Norvasc 10 MG tablet Generic drug: amLODipine Take 5 mg by mouth daily.   polyethylene glycol 17 g packet Commonly known as: MIRALAX / GLYCOLAX Take 17 g by mouth daily.   predniSONE 50 MG tablet Commonly known as: DELTASONE Take 1 tablet (50 mg total) by mouth daily with breakfast.       Not on File  Consultations: Palliative care   Procedures/Studies: Ct Head Wo Contrast  Result Date: 04/26/2019 CLINICAL DATA:  Left forehead hematoma following a fall onto the floor. Ataxia. EXAM: CT HEAD WITHOUT CONTRAST TECHNIQUE: Contiguous axial images were obtained from the base of the skull through the vertex without intravenous contrast. COMPARISON:  None. FINDINGS: Brain: There is some limitation due to patient motion. Old or subacute right cerebellar hemisphere infarcts. Minimally enlarged ventricles and subarachnoid spaces. No intracranial hemorrhage or mass lesion. Vascular: No hyperdense vessel or unexpected calcification. Skull: Normal. Negative for fracture or focal lesion.  Sinuses/Orbits: No acute finding. Other: Left frontal scalp hematoma. IMPRESSION: 1. Limited examination due to patient motion. 2. Old or subacute right cerebellar hemisphere infarcts. 3. Left frontal scalp hematoma without skull fracture or intracranial hemorrhage. 4. Minimal diffuse cerebral and cerebellar atrophy. Electronically Signed   By: Beckie Salts M.D.   On: 04/26/2019 10:05   Ct Angio Chest Pe W And/or Wo Contrast  Result Date: 04/11/2019 CLINICAL DATA:  Chest pain, complex, history of acute PE 03/28/2019 EXAM: CT ANGIOGRAPHY CHEST WITH CONTRAST TECHNIQUE: Multidetector CT imaging of the chest was performed using the standard protocol during bolus administration of intravenous contrast. Multiplanar CT image reconstructions and MIPs were obtained to evaluate the vascular anatomy. CONTRAST:  OMNIPAQUE IOHEXOL 350 MG/ML SOLN COMPARISON:  Chest x-ray 04/10/2019 FINDINGS: Cardiovascular: Limited opacification of the distal branch vessels within the lower lobes. Adequate opacification of the central pulmonary arteries which are without acute filling defect. Suspected small subsegmental filling defects within the right upper and lower lobes. No ascending aortic dissection. Coronary vascular calcification. Borderline heart size. No pericardial effusion. Aneurysmal dilatation of the distal arch up to 4.1 cm. Irregular crescent shaped peripheral hypodensity within the distal arch with some displacement of wall calcification, possibly representing thrombosed dissection. This is of unknown chronicity. 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 No  retroperitoneal hematoma or stranding to suggest leakage or rupture. Mediastinum/Nodes: Midline trachea. No thyroid mass. Borderline lymph nodes, for example right paratracheal lymph node measures 13 mm. Esophagus within normal limits. Lungs/Pleura: Mild emphysema. Trace pleural  effusions. Peribronchial thickening in the lower lobes. Partial consolidations in the lingula and bilateral left greater than right lower lobes. No pneumothorax Upper Abdomen: Incompletely visualized abdominal aortic dissection. Musculoskeletal: No acute or suspicious osseous lesion. IMPRESSION: 1. No acute central embolus is seen. Suboptimal contrast opacification of the distal branch vessels which limits the exam. Suspected small subsegmental right upper and lower lobe emboli, uncertain if these are acute or subacute given history of known pulmonary emboli. No previous chest CT available for comparison. 2. Aneurysmal dilatation of the distal arch up to 4.1 cm with irregular peripheral thrombus, perhaps representing thrombosed dissection. This is of unknown chronicity. No hematoma or stranding to suggest leakage. Recommend semi-annual imaging followup by CTA or MRA and referral to cardiothoracic surgery if not already obtained. This recommendation follows 2010 ACCF/AHA/AATS/ACR/ASA/SCA/SCAI/SIR/STS/SVM Guidelines for the Diagnosis and Management of Patients With Thoracic Aortic Disease. Circulation. 2010; 121: Y774-J28. Aortic aneurysm NOS (ICD10-I71.9) 3. 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 4. Trace pleural effusions. Partial atelectasis or pneumonia in the lingula and lung bases. Aortic aneurysm NOS (ICD10-I71.9). Aortic Atherosclerosis (ICD10-I70.0) and Emphysema (ICD10-J43.9). Critical Value/emergent results were called by telephone at the time of interpretation on 04/11/2019 at 3:17 am to providerXenia Blount, who verbally acknowledged these results. Electronically Signed   By: Jasmine Pang M.D.   On: 04/11/2019 03:17   Ct Angio Aortobifemoral W And/or Wo Contrast  Result Date: 04/11/2019 CLINICAL DATA:  [facility]  03/28/2019 for acute occlusion of the left SFA EXAM: CT ANGIOGRAPHY OF ABDOMINAL AORTA WITH ILIOFEMORAL RUNOFF TECHNIQUE: Multidetector CT imaging of the abdomen, pelvis and lower extremities was performed using the standard protocol during bolus administration of intravenous contrast. Multiplanar CT image reconstructions and MIPs were obtained to evaluate the vascular anatomy. 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 The left-sided opacified, lenticular lumen seen more anteriorly is the true lumen when viewed on comparison exams. Distal infrarenal abdominal aortic caliber measures up to 53 mm, stable from comparison when measured at a similar level. IMA: True lumen gives rise to the IMA without proximal stenosis or occlusion. Suboptimal opacification given configuration of the aortic dissection. No gross abnormality is seen. RIGHT Lower Extremity Inflow: Extension of the dissection in right common iliac terminating at the level of the internal/external bifurcation. Maximal diameter measures up to 3.7 cm with the iliac artery supplied by the false lumen, a maximal luminal diameter of 2.1 cm. Internal and external iliac arteries are diseased but without evidence of dissection flap, stenosis or occlusion. Outflow: Patent right superficial femoral artery and profundus femoris arteries. Mild disease throughout the vessel. Runoff: There is aneurysm of the right popliteal artery to a maximum luminal diameter of 3.9 cm which is unchanged from comparison. There is abundant mural plaque and thrombus. Vessel returns to a normal caliber at the bifurcation of the anterior tibial artery and tibioperoneal trunk. Patent but decreased three-vessel runoff to the right lower extremity. Posterior tibial and dorsalis pedis  remain  patent. LEFT Lower Extremity Inflow: Extension of the aortic aneurysm into the left common iliac with a maximum vessel diameter of 3 cm. Aneurysm extends to the level of the bifurcation with the false lumen supplying the internal iliac artery and the true lumen appearing to supply the left external iliac. Opacified portions of the internal and external iliac arteries are unremarkable. Outflow: Patency of the common femoral and profundus femora. The superficial femoral artery abruptly occludes at the level of the mid femur had a similar level to the comparison exam. The vessel remains occluded throughout the popliteal artery segment. The popliteal artery itself appears somewhat expanded with a maximal diameter of 18 mm. Runoff: There is reconstituted three-vessel distal runoff in the calf supplied by collaterals. (5/252). Flow within the anterior tibial artery tapers at the level of the ankle. Similar to comparison with non opacification of the dorsalis pedis. The posterior tibial artery remains patent to the level of the pedal arch. Veins: No obvious venous abnormality within the limitations of this arterial phase study. Review of the MIP images confirms the above findings. NON-VASCULAR Urinary Tract: Mild bladder wall distension. No acute abnormality is seen. Distal ureters are largely decompressed. Bowel: No small bowel dilatation or wall thickening. A normal appendix is visualized. No visible colonic thickening or dilatation. Scattered colonic diverticula without focal pericolonic inflammation to suggest diverticulitis. Lymphatic: No suspicious or enlarged lymph nodes in the included lymphatic chains. Reproductive: Coarse eccentric calcification of the prostate. No concerning abnormalities of the prostate or seminal vesicles. Small bilateral hydroceles. Other: No abdominopelvic free fluid or free gas. No bowel containing hernias. Circumferential body wall edema is noted. Musculoskeletal: There is circumferential  soft tissue edema and skin thickening of both lower extremities most pronounced at the level of the cavus and feet. No soft tissue gas or foreign body is evident. Metallic side plate and screw construct is seen along the right lateral tibial plateau, no evidence of hardware fracture or loosening. Included portions of the pelvis are free of acute abnormality with moderate to severe degenerative changes in both hips. Additional degenerative features are noted in lower lumbar spine. There is levocurvature of the spine. Sclerotic endplate changes at the lower lumbar levels are unchanged from prior. IMPRESSION: VASCULAR: 1. 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 The true lumen is the more diminutive, lentiform lumen seen a left anterolaterally. Stable diameter of the distal aortic aneurysm up to 5.3 cm. 2. Extension into the right common iliac with aneurysm measuring up to 3.7 cm in diameter. False lumen appears to supply both the internal and external iliac arteries. 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 Normal 3 vessel runoff. 3. Extension of the aortic aneurysm into the left common iliac with a maximum vessel diameter of 3 cm, true lumen supplies the external iliac with the false lumen supplies the internal iliac. 4. Occlusion of the left superficial femoral artery at the level of the mid femur extending through the left popliteal artery with reconstituted three-vessel distal runoff in the calf supplied by collaterals. 5. Reconstituted three-vessel runoff of the left lower extremity but with distal tapering of the distal left anterior tibial artery with non opacification of the dorsalis pedis. NON-VASCULAR 1. Bilateral lower extremity edema and cellulitis, right greater than left. No discrete collection or abscess is seen. 2. Colonic diverticulosis without evidence of acute diverticulitis. 3. Post ORIF of the right  lateral tibial plateau. 4. 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 Electronically Signed   By: Kreg Shropshire M.D.   On: 04/11/2019 02:48   Dg Chest Port 1 View  Result Date: 04/26/2019 CLINICAL DATA:  EMS was called for fall, pt found on floor- has hematoma to left side of forehead- snoring during exam. Pt unable to be awaken during exam. EXAM: PORTABLE CHEST 1 VIEW COMPARISON:  04/10/2019 and older exams FINDINGS: Cardiac silhouette mildly enlarged.  No mediastinal or hilar masses. Lung base opacities consistent with scarring or atelectasis. There are prominent bilateral bronchovascular markings, accentuated by low lung volumes. No convincing pneumonia and no evidence of pulmonary edema. No pleural effusion or pneumothorax. Skeletal structures are grossly intact. IMPRESSION: 1. No acute cardiopulmonary disease. Electronically Signed   By: Amie Portland M.D.   On: 04/26/2019 09:40   Dg Chest Portable 1 View  Result Date: 04/10/2019 CLINICAL DATA:  73 year old male with shortness of breath. EXAM: PORTABLE CHEST 1 VIEW COMPARISON:  Chest radiograph dated 03/27/2019 FINDINGS: There is eventration of the left hemidiaphragm similar to prior radiograph. There is diffuse interstitial and vascular prominence which may represent a degree of congestion and edema. Pneumonia is not excluded. Clinical correlation is recommended. There is no large pleural effusion. No pneumothorax. Stable cardiomegaly. No acute osseous pathology. IMPRESSION: Cardiomegaly with findings of possible CHF. Pneumonia is not excluded. Clinical correlation is recommended. Electronically Signed   By: Elgie Collard M.D.   On: 04/10/2019 15:08   Dg Foot 2 Views Left  Result Date: 04/10/2019 CLINICAL DATA:  Foot pain. EXAM: LEFT FOOT - 2 VIEW COMPARISON:  None. FINDINGS: There is no evidence of fracture or dislocation. There is no evidence of arthropathy or other focal bone abnormality. Soft tissues are unremarkable. IMPRESSION: Negative.  Electronically Signed   By: Lupita Raider M.D.   On: 04/10/2019 15:07      Discharge Exam: Vitals:   04/28/19 0613 04/28/19 0814  BP: (!) 141/87   Pulse: 92 99  Resp: 20 (!) 24  Temp: 97.9 F (36.6 C)   SpO2: (!) 56% 96%   Vitals:   04/27/19 2127 04/27/19 2129 04/28/19 0613 04/28/19 0814  BP:   (!) 141/87   Pulse:   92 99  Resp:   20 (!) 24  Temp:   97.9 F (36.6 C)   TempSrc:   Axillary   SpO2: 90% 90% (!) 56% 96%  Weight:      Height:        General: Pt is awake but confused, Cardiovascular: RRR, S1/S2 +, no rubs, no gallops Respiratory: Diminished breath sounds at the bases bilaterally Abdominal: Soft, NT, ND, bowel sounds + Extremities: +1 pitting edema bilateral lower extremity    The results of significant diagnostics from this hospitalization (including imaging, microbiology, ancillary and laboratory) are listed below for reference.     Microbiology: Recent Results (from the past 240 hour(s))  SARS Coronavirus 2 by RT PCR (hospital order, performed in [facility] hospital lab) Nasopharyngeal Nasopharyngeal Swab     Status: None   Collection Time: 04/26/19  9:17 AM   Specimen: Nasopharyngeal Swab  Result Value Ref Range Status   SARS Coronavirus 2 NEGATIVE NEGATIVE Final    Comment: (NOTE) If result is NEGATIVE SARS-CoV-2 target nucleic acids are NOT DETECTED. The SARS-CoV-2 RNA is generally detectable in upper and lower  respiratory specimens during the acute phase of infection. The lowest  concentration of SARS-CoV-2 viral copies this assay can detect is 250  copies / mL. A negative result does not preclude SARS-CoV-2 infection  and should not be used as the sole basis for treatment or other  patient management decisions.  A negative result may occur with  improper specimen collection / handling, submission of specimen other  than nasopharyngeal swab, presence of viral mutation(s) within the  areas targeted by this assay, and inadequate number of  viral copies  (<250 copies / mL). A negative result must be combined with clinical  observations, patient history, and epidemiological information. If result is POSITIVE SARS-CoV-2 target nucleic acids are DETECTED. The SARS-CoV-2 RNA is generally detectable in upper and lower  respiratory specimens dur ing the acute phase of infection.  Positive  results are indicative of active infection with SARS-CoV-2.  Clinical  correlation with patient history and other diagnostic information is  necessary to determine patient infection status.  Positive results do  not rule out bacterial infection or co-infection with other viruses. If result is PRESUMPTIVE POSTIVE SARS-CoV-2 nucleic acids MAY BE PRESENT.   A presumptive positive result was obtained on the submitted specimen  and confirmed on repeat testing.  While 2019 novel coronavirus  (SARS-CoV-2) nucleic acids may be present in the submitted sample  additional confirmatory testing may be necessary for epidemiological  and / or clinical management purposes  to differentiate between  SARS-CoV-2 and other Sarbecovirus currently known to infect humans.  If clinically indicated additional testing with an alternate test  methodology [phone] is advised. The SARS-CoV-2 RNA is generally  detectable in upper and lower respiratory sp ecimens during the acute  phase of infection. The expected result is Negative. Fact Sheet for Patients:  BoilerBrush.com.cyhttps://www.fda.gov/media/136312/download Fact Sheet for Healthcare Providers: https://pope.com/https://www.fda.gov/media/136313/download This test is not yet approved or cleared by the [place] FDA and has been authorized for detection and/or diagnosis of SARS-CoV-2 by FDA under an Emergency Use Authorization (EUA).  This EUA will remain in effect (meaning this test can be used) for the duration of the COVID-19 declaration under Section 564(b)(1) of the Act, 21 U.S.C. section 360bbb-3(b)(1), unless the authorization is  terminated or revoked sooner. Performed at [facility], [address] [place], [place] [address]   Blood Culture (routine x 2)     Status: None (Preliminary result)   Collection Time: 04/26/19  9:38 AM   Specimen: BLOOD RIGHT FOREARM  Result Value Ref Range Status   Specimen Description BLOOD RIGHT FOREARM  Final   Special Requests   Final    BOTTLES DRAWN AEROBIC AND ANAEROBIC Blood Culture results may not be optimal due to an inadequate volume of blood received in culture bottles   Culture   Final    NO GROWTH 2 DAYS Performed at [facility], [address] [place], [place] [address]    Report Status PENDING  Incomplete  Blood Culture (routine x 2)     Status: None (Preliminary result)   Collection Time: 04/26/19  9:38 AM   Specimen: BLOOD LEFT HAND  Result Value Ref Range Status   Specimen Description BLOOD LEFT HAND  Final   Special Requests   Final    BOTTLES DRAWN AEROBIC AND ANAEROBIC Blood Culture adequate volume   Culture   Final    NO GROWTH 2 DAYS Performed at [facility], [address] [place], [place] [address]    Report Status PENDING  Incomplete  Urine culture     Status: Abnormal   Collection Time: 04/26/19 10:56 AM  Specimen: In/Out Cath Urine  Result Value Ref Range Status   Specimen Description IN/OUT CATH URINE  Final   Special Requests   Final    NONE Performed at [facility], [address] [place], [place] [address]    Culture MULTIPLE SPECIES PRESENT, SUGGEST RECOLLECTION (A)  Final   Report Status 04/27/2019 FINAL  Final     Labs: BNP (last 3 results) Recent Labs    04/10/19 1442  BNP 60.2   Basic Metabolic Panel: Recent Labs  Lab 04/26/19 0911 04/26/19 0917  NA 134* 137  K 4.7 4.8  CL  --  96*  CO2  --  29  GLUCOSE  --  92  BUN  --  21  CREATININE  --  1.14  CALCIUM  --  9.0   Liver Function Tests: Recent Labs  Lab 04/26/19 0917  AST 23  ALT 15  ALKPHOS 66  BILITOT 0.7  PROT 6.1*   ALBUMIN 3.2*   No results for input(s): LIPASE, AMYLASE in the last 168 hours. Recent Labs  Lab 04/26/19 0921  AMMONIA 25   CBC: Recent Labs  Lab 04/26/19 0911 04/26/19 0917  WBC  --  18.1*  NEUTROABS  --  14.9*  HGB 16.0 15.7  HCT 47.0 49.5  MCV  --  97.1  PLT  --  219   Cardiac Enzymes: No results for input(s): CKTOTAL, CKMB, CKMBINDEX, TROPONINI in the last 168 hours. BNP: Invalid input(s): POCBNP CBG: No results for input(s): GLUCAP in the last 168 hours. D-Dimer No results for input(s): DDIMER in the last 72 hours. Hgb A1c No results for input(s): HGBA1C in the last 72 hours. Lipid Profile No results for input(s): CHOL, HDL, LDLCALC, TRIG, CHOLHDL, LDLDIRECT in the last 72 hours. Thyroid function studies No results for input(s): TSH, T4TOTAL, T3FREE, THYROIDAB in the last 72 hours.  Invalid input(s): FREET3 Anemia work up No results for input(s): VITAMINB12, FOLATE, FERRITIN, TIBC, IRON, RETICCTPCT in the last 72 hours. Urinalysis    Component Value Date/Time   COLORURINE YELLOW 04/26/2019 1050   APPEARANCEUR CLEAR 04/26/2019 1050   LABSPEC 1.013 04/26/2019 1050   PHURINE 7.0 04/26/2019 1050   GLUCOSEU NEGATIVE 04/26/2019 1050   HGBUR NEGATIVE 04/26/2019 1050   BILIRUBINUR NEGATIVE 04/26/2019 1050   KETONESUR NEGATIVE 04/26/2019 1050   PROTEINUR NEGATIVE 04/26/2019 1050   NITRITE NEGATIVE 04/26/2019 1050   LEUKOCYTESUR NEGATIVE 04/26/2019 1050   Sepsis Labs Invalid input(s): PROCALCITONIN,  WBC,  LACTICIDVEN Microbiology Recent Results (from the past 240 hour(s))  SARS Coronavirus 2 by RT PCR (hospital order, performed in [facility] hospital lab) Nasopharyngeal Nasopharyngeal Swab     Status: None   Collection Time: 04/26/19  9:17 AM   Specimen: Nasopharyngeal Swab  Result Value Ref Range Status   SARS Coronavirus 2 NEGATIVE NEGATIVE Final    Comment: (NOTE) If result is NEGATIVE SARS-CoV-2 target nucleic acids are NOT DETECTED. The SARS-CoV-2  RNA is generally detectable in upper and lower  respiratory specimens during the acute phase of infection. The lowest  concentration of SARS-CoV-2 viral copies this assay can detect is 250  copies / mL. A negative result does not preclude SARS-CoV-2 infection  and should not be used as the sole basis for treatment or other  patient management decisions.  A negative result may occur with  improper specimen collection / handling, submission of specimen other  than nasopharyngeal swab, presence of viral mutation(s) within the  areas targeted by this assay,  and inadequate number of viral copies  (<250 copies / mL). A negative result must be combined with clinical  observations, patient history, and epidemiological information. If result is POSITIVE SARS-CoV-2 target nucleic acids are DETECTED. The SARS-CoV-2 RNA is generally detectable in upper and lower  respiratory specimens dur ing the acute phase of infection.  Positive  results are indicative of active infection with SARS-CoV-2.  Clinical  correlation with patient history and other diagnostic information is  necessary to determine patient infection status.  Positive results do  not rule out bacterial infection or co-infection with other viruses. If result is PRESUMPTIVE POSTIVE SARS-CoV-2 nucleic acids MAY BE PRESENT.   A presumptive positive result was obtained on the submitted specimen  and confirmed on repeat testing.  While 2019 novel coronavirus  (SARS-CoV-2) nucleic acids may be present in the submitted sample  additional confirmatory testing may be necessary for epidemiological  and / or clinical management purposes  to differentiate between  SARS-CoV-2 and other Sarbecovirus currently known to infect humans.  If clinically indicated additional testing with an alternate test  methodology [phone]) is advised. The SARS-CoV-2 RNA is generally  detectable in upper and lower respiratory sp ecimens during the acute  phase of  infection. The expected result is Negative. Fact Sheet for Patients:  StrictlyIdeas.no Fact Sheet for Healthcare Providers: BankingDealers.co.za This test is not yet approved or cleared by the [place] FDA and has been authorized for detection and/or diagnosis of SARS-CoV-2 by FDA under an Emergency Use Authorization (EUA).  This EUA will remain in effect (meaning this test can be used) for the duration of the COVID-19 declaration under Section 564(b)(1) of the Act, 21 U.S.C. section 360bbb-3(b)(1), unless the authorization is terminated or revoked sooner. Performed at [facility], [place] [address] [place], [place] [address]   Blood Culture (routine x 2)     Status: None (Preliminary result)   Collection Time: 04/26/19  9:38 AM   Specimen: BLOOD RIGHT FOREARM  Result Value Ref Range Status   Specimen Description BLOOD RIGHT FOREARM  Final   Special Requests   Final    BOTTLES DRAWN AEROBIC AND ANAEROBIC Blood Culture results may not be optimal due to an inadequate volume of blood received in culture bottles   Culture   Final    NO GROWTH 2 DAYS Performed at [facility], [place] [address] [place], [place] [address]    Report Status PENDING  Incomplete  Blood Culture (routine x 2)     Status: None (Preliminary result)   Collection Time: 04/26/19  9:38 AM   Specimen: BLOOD LEFT HAND  Result Value Ref Range Status   Specimen Description BLOOD LEFT HAND  Final   Special Requests   Final    BOTTLES DRAWN AEROBIC AND ANAEROBIC Blood Culture adequate volume   Culture   Final    NO GROWTH 2 DAYS Performed at [facility], [place] [address], [place], [place] [address]    Report Status PENDING  Incomplete  Urine culture     Status: Abnormal   Collection Time: 04/26/19 10:56 AM   Specimen: In/Out Cath Urine  Result Value Ref Range Status   Specimen Description IN/OUT CATH URINE  Final   Special Requests   Final     NONE Performed at [facility], [place] [address] [place], [place] [address]    Culture MULTIPLE SPECIES PRESENT, SUGGEST RECOLLECTION (A)  Final   Report Status 04/27/2019 FINAL  Final     Time coordinating discharge: Over 30 minutes  SIGNED:   Hughie Closs, MD  [facility] 04/28/2019, 3:12 PM  If 7PM-7AM, please contact night-coverage [email or web address] Password [email or web address]

## 2019-04-28 NOTE — Progress Notes (Signed)
Manufacturing engineer - Hospice  Spoke with sister Prentiss Bells by phone this morning. Per Prentiss Bells plan is for patient to return to independent living with 24/7 care that has been arranged. Confirmed patient is no longer active with [facility]. Spoke with Randall Hiss at [facility] to confirm address and discuss equipment. Spoke with [facility] manager Nira Conn and [facility] NP Haynes Dage re change in plans for patient. Eligibility has been confirmed by hospice physician. Hospital bed, OBT, Oxygen setup, RW ordered for delivery to home. Note DME already in home. Note plan for patient to transfer home via [facility]. Appreciate report from [place] regarding plan for 24/7 care to begin this evening. Admission visit scheduled for 10 am tomorrow.   Please send patient home with signed and completed DNR.  Please send scripts for any medications he does not have.   Thank you,  Erling Conte, LCSW [phone]

## 2019-04-28 NOTE — Progress Notes (Signed)
I spoke with Nathan Cantu about who she was using for 24 hour care in [name] independent living apartment.  She is using [facility].    The contact is Gibson Ramp, email [email or web address].  Contact number [phone] or cell [phone]  Florentina Jenny, PA-C Palliative Medicine

## 2019-04-28 NOTE — TOC Initial Note (Addendum)
Transition of Care [facility]) - Initial/Assessment Note    Patient Details  Name: Nathan Cantu MRN: [id number] Date of Birth: 1945-12-16  Transition of Care [facility]) CM/SW Contact:    Marilu Favre, RN Phone Number: 04/28/2019, 1:56 PM  Clinical Narrative:                  Spoke to patient at bedside and sister Prentiss Bells via phone [phone]. Plan is for patient to go back to [facility]. Sister has arranged for 24/7 care starting this evening.   Patient and sister want to change hospice agencies from [facility] to [facility]. Hoyle Sauer with [facility] hospice aware , patient has 10 L oxygen concentrator and shower chair in home. Patient and sister confirm, patient also has hospital bed in home.   Referral given to Erling Conte with [facility], awaiting call back.   Patient to be discharged by PTAR to [address] ,[place] , [place],  [address]   Prentiss Bells wants to know time patient will arrive at home admission nurse from [facility] will meet him there. Explained PTAR will give an estimate of when pick up will be. Sister requesting bedside nurse to call her when [facility] arrives to floor. Patient has key to get in. DR [facility] aware   1542 PTAR estimated time pick one to one and half hours. Miriam aware and wants bedside nurse to call her when PTAR arrives. Expected Discharge Plan: Home w Hospice Care Barriers to Discharge: Continued Medical Work up   Patient Goals and CMS Choice Patient states their goals for this hospitalization and ongoing recovery are:: to go home with hospice CMS Medicare.gov Compare Post Acute Care list provided to:: Patient Choice offered to / list presented to : Patient, Sibling  Expected Discharge Plan and Services Expected Discharge Plan: [place]   Discharge Planning Services: CM Consult Post Acute Care Choice: Hospice Living arrangements for the past 2 months: [place]: Hospice and  [place]        Prior Living Arrangements/Services Living arrangements for the past 2 months: Apartment Lives with:: Self Patient language and need for interpreter reviewed:: Yes        Need for Family Participation in Patient Care: Yes (Comment) Care giver support system in place?: Yes (comment) Current home services: DME Criminal Activity/Legal Involvement Pertinent to Current Situation/Hospitalization: No - Comment as needed  Activities of Daily Living      Permission Sought/Granted   Permission granted to share information with : Yes, Verbal Permission Granted  Share Information with NAME: Prentiss Bells sister  Permission granted to share info w AGENCY: [facility]        Emotional Assessment Appearance:: Appears stated age Attitude/Demeanor/Rapport: Engaged Affect (typically observed): Accepting Orientation: : Oriented to Self, Oriented to Place, Oriented to  Time, Oriented to Situation Alcohol / Substance Use: Not Applicable Psych Involvement: No (comment)  Admission diagnosis:  Acute on chronic respiratory failure with hypoxia and hypercapnia (HCC) [J09.32, J96.22] Patient Active Problem List   Diagnosis Date Noted  . Comfort measures only status 04/26/2019  . Leukocytosis 04/26/2019  . Fall at home, initial encounter 04/26/2019  . Acute on chronic respiratory failure with hypoxia and hypercapnia (HCC)   . Palliative care encounter   . Acute respiratory failure with hypercapnia ([place]) 04/10/2019  .  Pulmonary embolism (HCC) 04/10/2019  . COPD (chronic obstructive pulmonary disease) (HCC) 04/10/2019  . PVD (peripheral vascular disease) (HCC) 04/10/2019  . Tobacco abuse 04/10/2019  . Cellulitis 04/10/2019  . AKI (acute kidney injury) (HCC) 04/10/2019  . Hypomagnesemia 04/10/2019   PCP:  Judd Lien, PA-C Pharmacy:   [facility] [place] [place] [facility] [place] [address] [place] [address] Phone:  [phone] Fax: [phone]     Social Determinants of Health (SDOH) Interventions    Readmission Risk Interventions No flowsheet data found.

## 2019-05-01 LAB — CULTURE, BLOOD (ROUTINE X 2)
Culture: NO GROWTH
Culture: NO GROWTH
Special Requests: ADEQUATE

## 2019-07-20 ENCOUNTER — Emergency Department (HOSPITAL_COMMUNITY)
Admission: EM | Admit: 2019-07-20 | Discharge: 2019-07-28 | Disposition: E | Attending: Emergency Medicine | Admitting: Emergency Medicine

## 2019-07-20 ENCOUNTER — Emergency Department (HOSPITAL_COMMUNITY)

## 2019-07-20 DIAGNOSIS — I96 Gangrene, not elsewhere classified: Secondary | ICD-10-CM | POA: Diagnosis not present

## 2019-07-20 DIAGNOSIS — J449 Chronic obstructive pulmonary disease, unspecified: Secondary | ICD-10-CM | POA: Diagnosis not present

## 2019-07-20 DIAGNOSIS — R4182 Altered mental status, unspecified: Secondary | ICD-10-CM | POA: Diagnosis present

## 2019-07-20 DIAGNOSIS — Z20822 Contact with and (suspected) exposure to covid-19: Secondary | ICD-10-CM | POA: Insufficient documentation

## 2019-07-20 DIAGNOSIS — Z79899 Other long term (current) drug therapy: Secondary | ICD-10-CM | POA: Diagnosis not present

## 2019-07-20 DIAGNOSIS — E669 Obesity, unspecified: Secondary | ICD-10-CM | POA: Diagnosis not present

## 2019-07-20 DIAGNOSIS — N179 Acute kidney failure, unspecified: Secondary | ICD-10-CM | POA: Diagnosis not present

## 2019-07-20 DIAGNOSIS — A419 Sepsis, unspecified organism: Secondary | ICD-10-CM

## 2019-07-20 DIAGNOSIS — Z7901 Long term (current) use of anticoagulants: Secondary | ICD-10-CM | POA: Insufficient documentation

## 2019-07-20 DIAGNOSIS — R6521 Severe sepsis with septic shock: Secondary | ICD-10-CM | POA: Insufficient documentation

## 2019-07-20 DIAGNOSIS — L03119 Cellulitis of unspecified part of limb: Secondary | ICD-10-CM | POA: Insufficient documentation

## 2019-07-20 DIAGNOSIS — E875 Hyperkalemia: Secondary | ICD-10-CM | POA: Diagnosis not present

## 2019-07-20 LAB — RESPIRATORY PANEL BY RT PCR (FLU A&B, COVID)
Influenza A by PCR: NEGATIVE
Influenza B by PCR: NEGATIVE
SARS Coronavirus 2 by RT PCR: NEGATIVE

## 2019-07-20 LAB — CBC WITH DIFFERENTIAL/PLATELET
Abs Immature Granulocytes: 0.2 10*3/uL — ABNORMAL HIGH (ref 0.00–0.07)
Basophils Absolute: 0.1 10*3/uL (ref 0.0–0.1)
Basophils Relative: 0 %
Eosinophils Absolute: 0 10*3/uL (ref 0.0–0.5)
Eosinophils Relative: 0 %
HCT: 47.4 % (ref 39.0–52.0)
Hemoglobin: 14.7 g/dL (ref 13.0–17.0)
Immature Granulocytes: 1 %
Lymphocytes Relative: 2 %
Lymphs Abs: 0.4 10*3/uL — ABNORMAL LOW (ref 0.7–4.0)
MCH: 30.2 pg (ref 26.0–34.0)
MCHC: 31 g/dL (ref 30.0–36.0)
MCV: 97.5 fL (ref 80.0–100.0)
Monocytes Absolute: 1 10*3/uL (ref 0.1–1.0)
Monocytes Relative: 5 %
Neutro Abs: 18.1 10*3/uL — ABNORMAL HIGH (ref 1.7–7.7)
Neutrophils Relative %: 92 %
Platelets: 322 10*3/uL (ref 150–400)
RBC: 4.86 MIL/uL (ref 4.22–5.81)
RDW: 16.6 % — ABNORMAL HIGH (ref 11.5–15.5)
WBC: 19.7 10*3/uL — ABNORMAL HIGH (ref 4.0–10.5)
nRBC: 0 % (ref 0.0–0.2)

## 2019-07-20 LAB — COMPREHENSIVE METABOLIC PANEL
ALT: 26 U/L (ref 0–44)
AST: 44 U/L — ABNORMAL HIGH (ref 15–41)
Albumin: 2.7 g/dL — ABNORMAL LOW (ref 3.5–5.0)
Alkaline Phosphatase: 79 U/L (ref 38–126)
Anion gap: 21 — ABNORMAL HIGH (ref 5–15)
BUN: 145 mg/dL — ABNORMAL HIGH (ref 8–23)
CO2: 16 mmol/L — ABNORMAL LOW (ref 22–32)
Calcium: 8.6 mg/dL — ABNORMAL LOW (ref 8.9–10.3)
Chloride: 94 mmol/L — ABNORMAL LOW (ref 98–111)
Creatinine, Ser: 8.22 mg/dL — ABNORMAL HIGH (ref 0.61–1.24)
GFR calc Af Amer: 7 mL/min — ABNORMAL LOW (ref >60)
GFR calc non Af Amer: 6 mL/min — ABNORMAL LOW (ref >60)
Glucose, Bld: 117 mg/dL — ABNORMAL HIGH (ref 70–99)
Potassium: >7.5 mmol/L (ref 3.5–5.1)
Sodium: 131 mmol/L — ABNORMAL LOW (ref 135–145)
Total Bilirubin: 0.5 mg/dL (ref 0.3–1.2)
Total Protein: 7.2 g/dL (ref 6.5–8.1)

## 2019-07-20 LAB — APTT: aPTT: 32 seconds (ref 24–36)

## 2019-07-20 LAB — CK: Total CK: 3166 U/L — ABNORMAL HIGH (ref 49–397)

## 2019-07-20 LAB — PROTIME-INR
INR: 1 (ref 0.8–1.2)
Prothrombin Time: 13.4 seconds (ref 11.4–15.2)

## 2019-07-20 LAB — AMMONIA: Ammonia: 19 umol/L (ref 9–35)

## 2019-07-20 LAB — LACTIC ACID, PLASMA: Lactic Acid, Venous: 1.6 mmol/L (ref 0.5–1.9)

## 2019-07-20 MED ORDER — SODIUM CHLORIDE 0.9 % IV BOLUS
500.0000 mL | Freq: Once | INTRAVENOUS | Status: AC
  Administered 2019-07-20: 500 mL via INTRAVENOUS

## 2019-07-20 MED ORDER — LORAZEPAM 2 MG/ML IJ SOLN
0.5000 mg | Freq: Once | INTRAMUSCULAR | Status: AC
  Administered 2019-07-20: 0.5 mg via INTRAVENOUS
  Filled 2019-07-20: qty 1

## 2019-07-20 MED ORDER — INSULIN ASPART 100 UNIT/ML IV SOLN
5.0000 [IU] | Freq: Once | INTRAVENOUS | Status: AC
  Administered 2019-07-20: 5 [IU] via INTRAVENOUS

## 2019-07-20 MED ORDER — ALBUTEROL SULFATE (2.5 MG/3ML) 0.083% IN NEBU
10.0000 mg | INHALATION_SOLUTION | Freq: Once | RESPIRATORY_TRACT | Status: DC
  Filled 2019-07-20: qty 12

## 2019-07-20 MED ORDER — SODIUM CHLORIDE 0.9 % IV SOLN
2.0000 g | Freq: Once | INTRAVENOUS | Status: AC
  Administered 2019-07-20: 2 g via INTRAVENOUS
  Filled 2019-07-20: qty 20

## 2019-07-20 MED ORDER — LORAZEPAM 2 MG/ML IJ SOLN
1.0000 mg | Freq: Once | INTRAMUSCULAR | Status: AC
  Administered 2019-07-20: 1 mg via INTRAVENOUS
  Filled 2019-07-20: qty 1

## 2019-07-20 MED ORDER — VANCOMYCIN HCL IN DEXTROSE 1-5 GM/200ML-% IV SOLN: 1000.0000 mg | Freq: Once | INTRAVENOUS | Status: DC

## 2019-07-20 MED ORDER — CALCIUM GLUCONATE 10 % IV SOLN
1.0000 g | Freq: Once | INTRAVENOUS | Status: AC
  Administered 2019-07-20: 14:00:00 1 g via INTRAVENOUS
  Filled 2019-07-20: qty 10

## 2019-07-20 MED ORDER — DEXTROSE 50 % IV SOLN
1.0000 | Freq: Once | INTRAVENOUS | Status: AC
  Administered 2019-07-20: 50 mL via INTRAVENOUS
  Filled 2019-07-20: qty 50

## 2019-07-20 MED ORDER — SODIUM BICARBONATE 8.4 % IV SOLN
50.0000 meq | Freq: Once | INTRAVENOUS | Status: AC
  Administered 2019-07-20: 50 meq via INTRAVENOUS
  Filled 2019-07-20: qty 50

## 2019-07-20 MED ORDER — VANCOMYCIN HCL 2000 MG/400ML IV SOLN
2000.0000 mg | Freq: Once | INTRAVENOUS | Status: AC
  Administered 2019-07-20: 2000 mg via INTRAVENOUS
  Filled 2019-07-20: qty 400

## 2019-07-25 LAB — CULTURE, BLOOD (ROUTINE X 2): Culture: NO GROWTH

## 2019-07-28 NOTE — Progress Notes (Signed)
[facility] Documentation   Pt is currently an [facility] hospice pt. Pt's sister notified [facility] triage nurse that staff at [facility] called [phone] due to pt being unable to get out of his chair and altered mental status.  Writer spoke with pt's sister to provide lab values and update. Sister agreed to have pt be admitted for IV fluids and IV ABT so that pt can stabilize.   Liaison will continue to follow during admission. Please do not hesitate to outreach [facility] with any hospice related questions.   Thank you,  Trena Platt, RN [facility] Liaison [phone]

## 2019-07-28 NOTE — Progress Notes (Signed)
Notified provider and bedside nurse of need to order fluid bolus.  Pt meets criteria for full fluid volume of based on his weight. Message sent to provider. Provider responded back hes a hospice patient with a most form refusing IV fluids, family has agreed with limited IV fluids

## 2019-07-28 NOTE — ED Provider Notes (Addendum)
[facility] EMERGENCY DEPARTMENT Provider Note   CSN: [id number] Arrival date & time: 25-Jul-2019  1128     History No chief complaint on file.   Nathan Cantu is a 74 y.o. male.  74 year old male brought in by EMS from independent care facility for altered mental status, weakness, found to be hypotensive with systolic pressure in the 80s. Patient was uncooperative with EMS, IV placed and patient transported to the hospital. Per bystanders, patient was in the same chair for possibly the past 2 days, patient was leaning to the left, prior CVA with left-sided weakness.  Patient was found with fluid versus emesis to his shirt, concern for aspiration.  Patient moans, only says no to any questions asked, level 5 caveat applies.        Past Medical History:  Diagnosis Date  . (HFpEF) heart failure with preserved ejection fraction (HCC)   . Cerebellar stroke (HCC)   . Chronic hypercapnic respiratory failure (HCC)   . Chronic hypoxemic respiratory failure (HCC)   . Femoral artery occlusion (HCC)   . Pulmonary embolism (HCC)   . Thoracoabdominal aneurysm (HCC)   . Tobacco abuse     Patient Active Problem List   Diagnosis Date Noted  . Comfort measures only status 04/26/2019  . Leukocytosis 04/26/2019  . Fall at home, initial encounter 04/26/2019  . Acute on chronic respiratory failure with hypoxia and hypercapnia (HCC)   . Palliative care encounter   . Acute respiratory failure with hypercapnia (HCC) 04/10/2019  . Pulmonary embolism (HCC) 04/10/2019  . COPD (chronic obstructive pulmonary disease) (HCC) 04/10/2019  . PVD (peripheral vascular disease) (HCC) 04/10/2019  . Tobacco abuse 04/10/2019  . Cellulitis 04/10/2019  . AKI (acute kidney injury) (HCC) 04/10/2019  . Hypomagnesemia 04/10/2019    No past surgical history on file.     Family History  Family history unknown: Yes    Social History   Tobacco Use  . Smoking status: Not on file    Substance Use Topics  . Alcohol use: Not on file  . Drug use: Not on file    Home Medications Prior to Admission medications   Medication Sig Start Date End Date Taking? Authorizing Provider  Albuterol Sulfate 2.5 MG/0.5ML NEBU Inhale 2.5 mg into the lungs every 4 (four) hours as needed (shortness of breath/wheezing).     [provider]  amLODipine (NORVASC) 10 MG tablet Take 5 mg by mouth daily.     [provider]  apixaban (ELIQUIS) 5 MG TABS tablet Take 5 mg by mouth 2 (two) times daily.    [provider]  arformoterol (BROVANA) 15 MCG/2ML NEBU Take 15 mcg by nebulization 2 (two) times daily. As directed    [provider]  budesonide (PULMICORT) 0.5 MG/2ML nebulizer solution Take 0.5 mg by nebulization 2 (two) times daily. As directed    [provider]  doxycycline (VIBRA-TABS) 100 MG tablet Take 1 tablet (100 mg total) by mouth 2 (two) times daily. 04/12/19   Almon Hercules, MD  glycopyrrolate (ROBINUL) 2 MG tablet Take 1 tablet (2 mg total) by mouth 3 (three) times daily as needed (oversecretion). 04/12/19   Almon Hercules, MD  lidocaine (XYLOCAINE) 5 % ointment Apply 1 application topically See admin instructions. Apply topically to left foot three times daily as directed    [provider]  LORazepam (ATIVAN) 0.5 MG tablet Take 1 tablet (0.5 mg total) by mouth every 4 (four) hours as needed for anxiety. 04/12/19  04/11/20  Mercy Riding, MD  morphine (ROXANOL) 20 MG/ML concentrated solution Take 0.5 mLs (10 mg total) by mouth every 3 (three) hours as needed for severe pain, anxiety or shortness of breath. 04/12/19   Mercy Riding, MD  polyethylene glycol (MIRALAX / GLYCOLAX) 17 g packet Take 17 g by mouth daily.     [provider]  predniSONE (DELTASONE) 50 MG tablet Take 1 tablet (50 mg total) by mouth daily with breakfast. 04/12/19   Mercy Riding, MD    Allergies    Patient has no allergy information on  record.  Review of Systems   Review of Systems  Unable to perform ROS: Mental status change    Physical Exam Updated Vital Signs BP (!) 107/53   Pulse 85   Resp 17   SpO2 96%   Physical Exam Vitals and nursing note reviewed.  Constitutional:      Appearance: He is obese. He is ill-appearing.  HENT:     Head: Normocephalic and atraumatic.     Nose: Nose normal.  Eyes:     Pupils: Pupils are equal, round, and reactive to light.  Cardiovascular:     Rate and Rhythm: Normal rate and regular rhythm.     Heart sounds: Murmur present.  Pulmonary:     Effort: Tachypnea present.     Breath sounds: Examination of the right-lower field reveals decreased breath sounds. Examination of the left-lower field reveals decreased breath sounds. Decreased breath sounds present.     Comments: Difficult to assess lung sounds with constant moaning  Abdominal:     Palpations: Abdomen is soft.     Tenderness: There is no abdominal tenderness.  Musculoskeletal:        General: Tenderness present.     Cervical back: Neck supple.     Right lower leg: Edema present.     Left lower leg: Edema present.  Skin:    Findings: Erythema present.     Comments: Erythema to bilateral lower legs and feet with copious amounts of serous and some purulent drainage present.  Gangrene to left great and second toe.  Pain with palpation of the left lower leg, does not appear to have pain with palpation of the right lower leg.  Neurological:     GCS: GCS eye subscore is 3. GCS verbal subscore is 3. GCS motor subscore is 5.   Media Information   Document Information  Photos    2019-08-17 12:44  Attached To:  Hospital Encounter on 08-17-2019  Source Information  [facility]    Media Information   Document Information  Photos    17-Aug-2019 12:44  Attached To:  Hospital Encounter on 08/17/19  Source Information  [facility]    ED Results /  Procedures / Treatments   Labs (all labs ordered are listed, but only abnormal results are displayed) Labs Reviewed  COMPREHENSIVE METABOLIC PANEL - Abnormal; Notable for the following components:      Result Value   Sodium 131 (*)    Potassium >7.5 (*)    Chloride 94 (*)    CO2 16 (*)    Glucose, Bld 117 (*)    BUN 145 (*)    Creatinine, Ser 8.22 (*)    Calcium 8.6 (*)    Albumin 2.7 (*)    AST 44 (*)    GFR calc non Af Amer 6 (*)    GFR calc Af  Amer 7 (*)    Anion gap 21 (*)    All other components within normal limits  CBC WITH DIFFERENTIAL/PLATELET - Abnormal; Notable for the following components:   WBC 19.7 (*)    RDW 16.6 (*)    Neutro Abs 18.1 (*)    Lymphs Abs 0.4 (*)    Abs Immature Granulocytes 0.20 (*)    All other components within normal limits  CK - Abnormal; Notable for the following components:   Total CK 3,166 (*)    All other components within normal limits  RESPIRATORY PANEL BY RT PCR (FLU A&B, COVID)  CULTURE, BLOOD (ROUTINE X 2)  LACTIC ACID, PLASMA  APTT  PROTIME-INR  AMMONIA    EKG EKG Interpretation  Date/Time:  Sunday 08-08-19 11:38:32 EST Ventricular Rate:  89 PR Interval:    QRS Duration: 137 QT Interval:  373 QTC Calculation: 454 R Axis:   -133 Text Interpretation: Sinus rhythm Right bundle branch block Inferior infarct, old Lateral leads are also involved peaked Ts Confirmed by Jacalyn Lefevre [id number]) on 2019-08-08 12:51:56 PM   Radiology DG Chest Port 1 View  Result Date: 08/08/2019 CLINICAL DATA:  Bilateral lower extremity swelling and redness. EXAM: PORTABLE CHEST 1 VIEW COMPARISON:  Chest x-ray dated 04/26/2019. FINDINGS: There is now near complete opacification of the LEFT hemithorax. RIGHT lung remains clear. Cardiomediastinal silhouette is obscured by the LEFT chest opacity. Visualized osseous structures about the chest are unremarkable. IMPRESSION: 1. Near complete opacification of the LEFT hemithorax. This could  represent pneumonia or asymmetric pulmonary edema, favor pneumonia. Probable associated pleural effusion and/or atelectasis at the LEFT lung base. 2. RIGHT lung remains clear. Electronically Signed   By: Bary Richard M.D.   On: 08-08-19 13:56    Procedures .Critical Care Performed by: Jeannie Fend, PA-C Authorized by: Jeannie Fend, PA-C   Critical care provider statement:    Critical care time (minutes):  45   Critical care was time spent personally by me on the following activities:  Discussions with consultants, evaluation of patient's response to treatment, examination of patient, ordering and performing treatments and interventions, ordering and review of laboratory studies, ordering and review of radiographic studies, pulse oximetry, re-evaluation of patient's condition, obtaining history from patient or surrogate and review of old charts   (including critical care time)  Medications Ordered in ED Medications  albuterol (PROVENTIL) (2.5 MG/3ML) 0.083% nebulizer solution 10 mg (has no administration in time range)  cefTRIAXone (ROCEPHIN) 2 g in sodium chloride 0.9 % 100 mL IVPB (0 g Intravenous Stopped Aug 08, 2019 1254)  vancomycin (VANCOREADY) IVPB 2000 mg/400 mL (2,000 mg Intravenous New Bag/Given 08-08-19 1255)  sodium chloride 0.9 % bolus 500 mL (0 mLs Intravenous Stopped 08/08/2019 1300)  LORazepam (ATIVAN) injection 0.5 mg (0.5 mg Intravenous Given 08-Aug-2019 1220)  insulin aspart (novoLOG) injection 5 Units (5 Units Intravenous Given 08/08/2019 1404)    And  dextrose 50 % solution 50 mL (50 mLs Intravenous Given August 08, 2019 1401)  sodium bicarbonate injection 50 mEq (50 mEq Intravenous Given Aug 08, 2019 1359)  calcium gluconate inj 10% (1 g) URGENT USE ONLY! (1 g Intravenous Given 08-Aug-2019 1411)  LORazepam (ATIVAN) injection 1 mg (1 mg Intravenous Given Aug 08, 2019 1417)    ED Course  I have reviewed the triage vital signs and the nursing notes.  Pertinent labs & imaging results that were  available during my care of the patient were reviewed by me and considered in my medical decision making (see chart for  details).  Clinical Course as of Jul 20 1539  Sun 08/08/19  3628 74 year old male brought in by EMS for altered mental status, found to be hypotensive and hypoxic on EMS arrival, given oxygen and IV fluids.  On arrival patient moans and will answer no to any questions asked, appears to have pain in his left foot, lower extremities are wrapped in Chux pads which are soaked with serous and purulent fluid.  Patient has cellulitis to bilateral lower extremities, left foot found to have gangrene to left great and second toe.  Lung sounds difficult to obtain secondary to patient moaning however seem to be diminished in the bases.  Abdomen is nontender, soft.  Blood pressure improved with IV fluid bolus given by EMS, EMS was unaware of patient's MOST form refusing IV fluids.  Sepsis protocol initiated upon arrival due to hypotension with hypoxemia, altered mental status and obvious infection in the legs, concern for aspiration pneumonia as well.  Added IV fluids given however due to patient MOST form declining IV fluids.  This was discussed further with patient's sister and with hospice care, sister is agreeable with IV fluids to support blood pressure.   [LM]  1200 Contacted patient's sister Hansel Starling, sister last spoke with patient on Friday, said his voice was more slurred than usual but not abnormal for him. Reviewed DNR and MOST form, currently marked as no IV fluids, sister agrees with giving IV fluids for short term BP improvement, would not want IV fluids for nutritional support/long term. Sister would like hospice care to be contacted.    [LM]  1203 Consult to [facility] with hospice, Victorino Dike has spoken with patient's sister- current plan is for IV fluids to support BP, antibiotics as indicated, pain medications as needed, will try and place at [facility] today, if unable to get  a room at [facility], will need admission to the hospital.    [LM]  1345 Return call from [place] with hospice, patient's sister would like patient admitted to the hospital, may continue antibiotics and use of IV fluids to support BP.   [LM]  1346 BP has improved with bolus in the ER, bolus also given by EMS prior to arrival, BP currently 114/66, patient comfortable after 0.5mg  Ativan.    [LM]  1352 EKG with peaked T waves, consistent with patient's CMP with potassium greater than 7.5.  Patient also found to have new AKI with creatinine of 8.22, anion gap of 21 with bicarb of 16.  Patient continues to get IV fluids, will add CK, concern for rhabdomyolysis secondary to patient sitting in his chair for the past 2 days. WBC elevated at 19.7 with increase in neutrophils, receiving IV antibiotics.  Ammonia level within normal limits.  Patient has not produced a urine sample as of yet, his underwear was dry on exam despite him not having left dysuria for 2 days.   [LM]  1414 Discussed care with Dr. Particia Nearing, hyperkalemia treatment orders placed.    [LM]  1446 Patient became unstable, managed by Dr. Particia Nearing, plan at this time is comfort care only. Discussed with Victorino Dike with hospice, also updated patient's sister who is in [place]. Sister would like to be notified in the event patient dies.  Discussed with teaching service, will discuss with attending and call back.   [LM]  1459 Plan is for admission for end of life/comfort care.   [LM]  1540 Contacted patient's sister regarding his death. PCP Dr. Darleene Cleaver, will requests PCP  sign death certificate. [facility] [place].    [LM]    Clinical Course User Index [LM] Alden HippMurphy, Jason Frisbee A, PA-C   MDM Rules/Calculators/A&P                      Final Clinical Impression(s) / ED Diagnoses Final diagnoses:  Altered mental status, unspecified altered mental status type  Hyperkalemia  Cellulitis of lower extremity,  unspecified laterality  Gangrene of toe of left foot (HCC)  Sepsis with acute renal failure and septic shock, due to unspecified organism, unspecified acute renal failure type [facility](HCC)    Rx / DC Orders ED Discharge Orders    None       Jeannie FendMurphy, Algernon Mundie A, PA-C 2019-12-31 1459    Jeannie FendMurphy, Lareen Mullings A, PA-C 2019-12-31 1541    Jacalyn LefevreHaviland, Julie, MD 2019-12-31 1606

## 2019-07-28 NOTE — Progress Notes (Signed)
RT was unable to get ABG sample. ED MD notified, okay to discontinue order at this time.

## 2019-07-28 NOTE — ED Notes (Signed)
Bladder scan 62mL.

## 2019-07-28 NOTE — ED Triage Notes (Signed)
Per [facility] pt coming from independent nursing facility after staff noticed patient in same chair for a few days. Patient yelling and reporting pain. Bilateral lower extremity swelling, weeping and redness.

## 2019-07-28 NOTE — Progress Notes (Signed)
Code Sepsis monitoring discontinued due to expiration of patient.  

## 2019-07-28 DEATH — deceased

## 2021-05-18 IMAGING — DX DG FOOT 2V*L*
2 series · 2 of 2 positions shown · non-contrast
Comparison: None.

CLINICAL DATA: Foot pain.

EXAM:
LEFT FOOT - 2 VIEW

[foot ap]
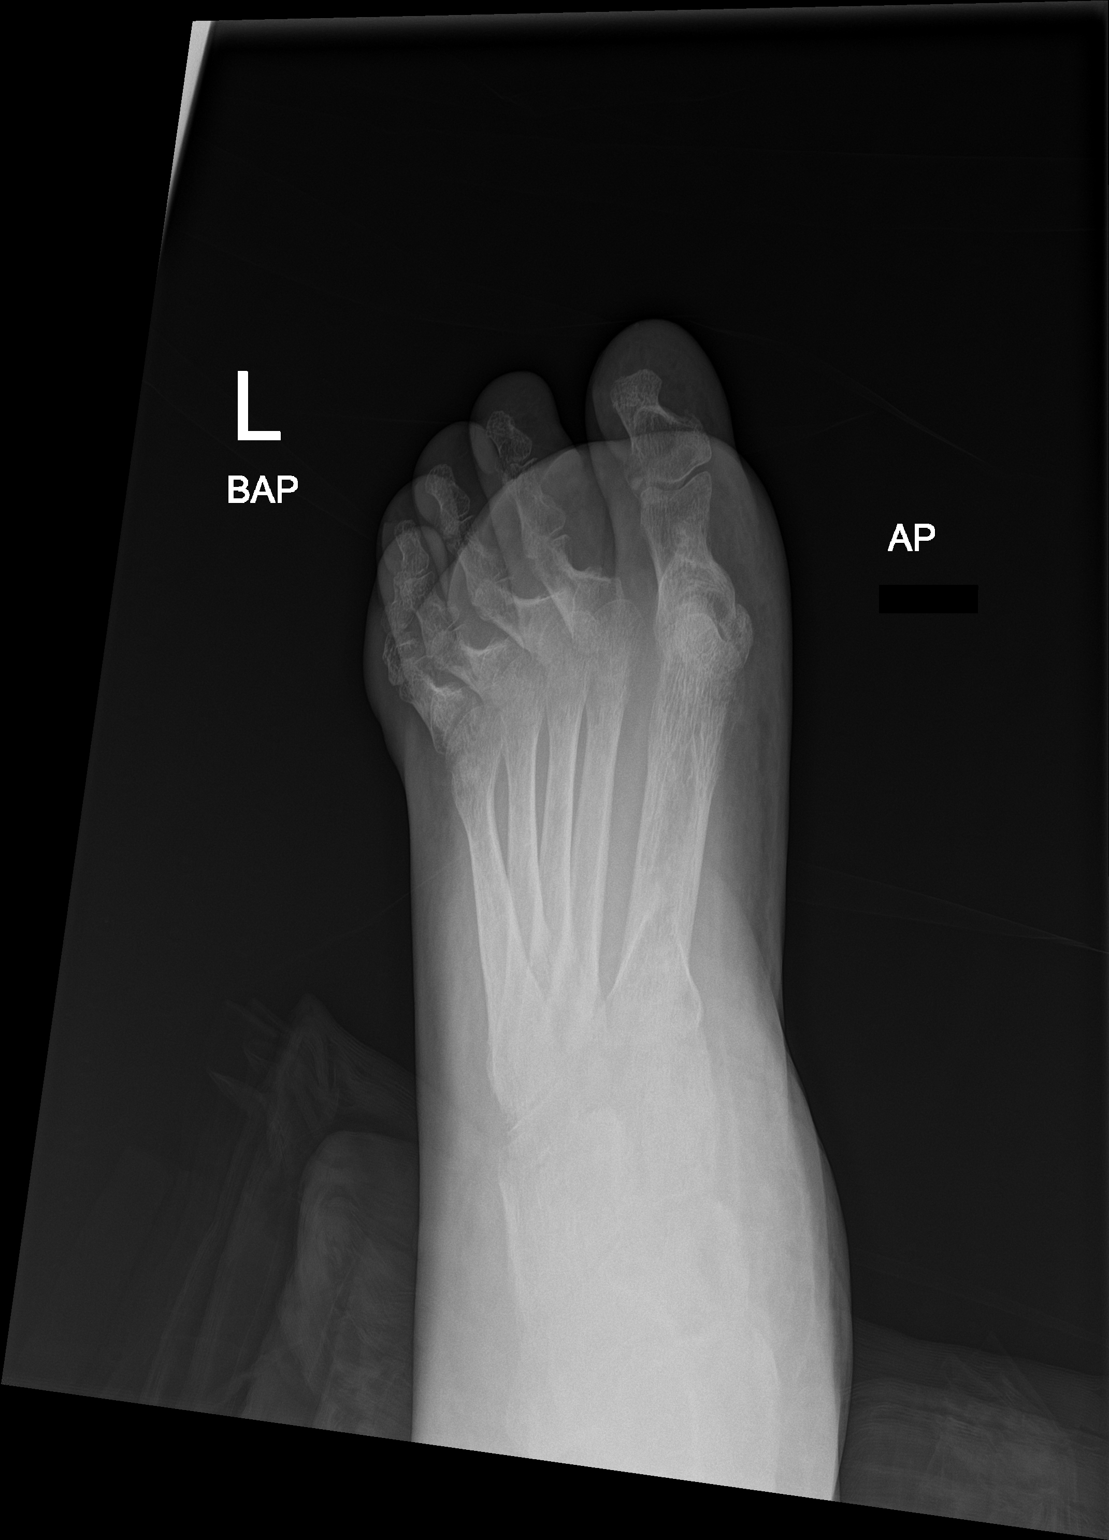

[foot lat]
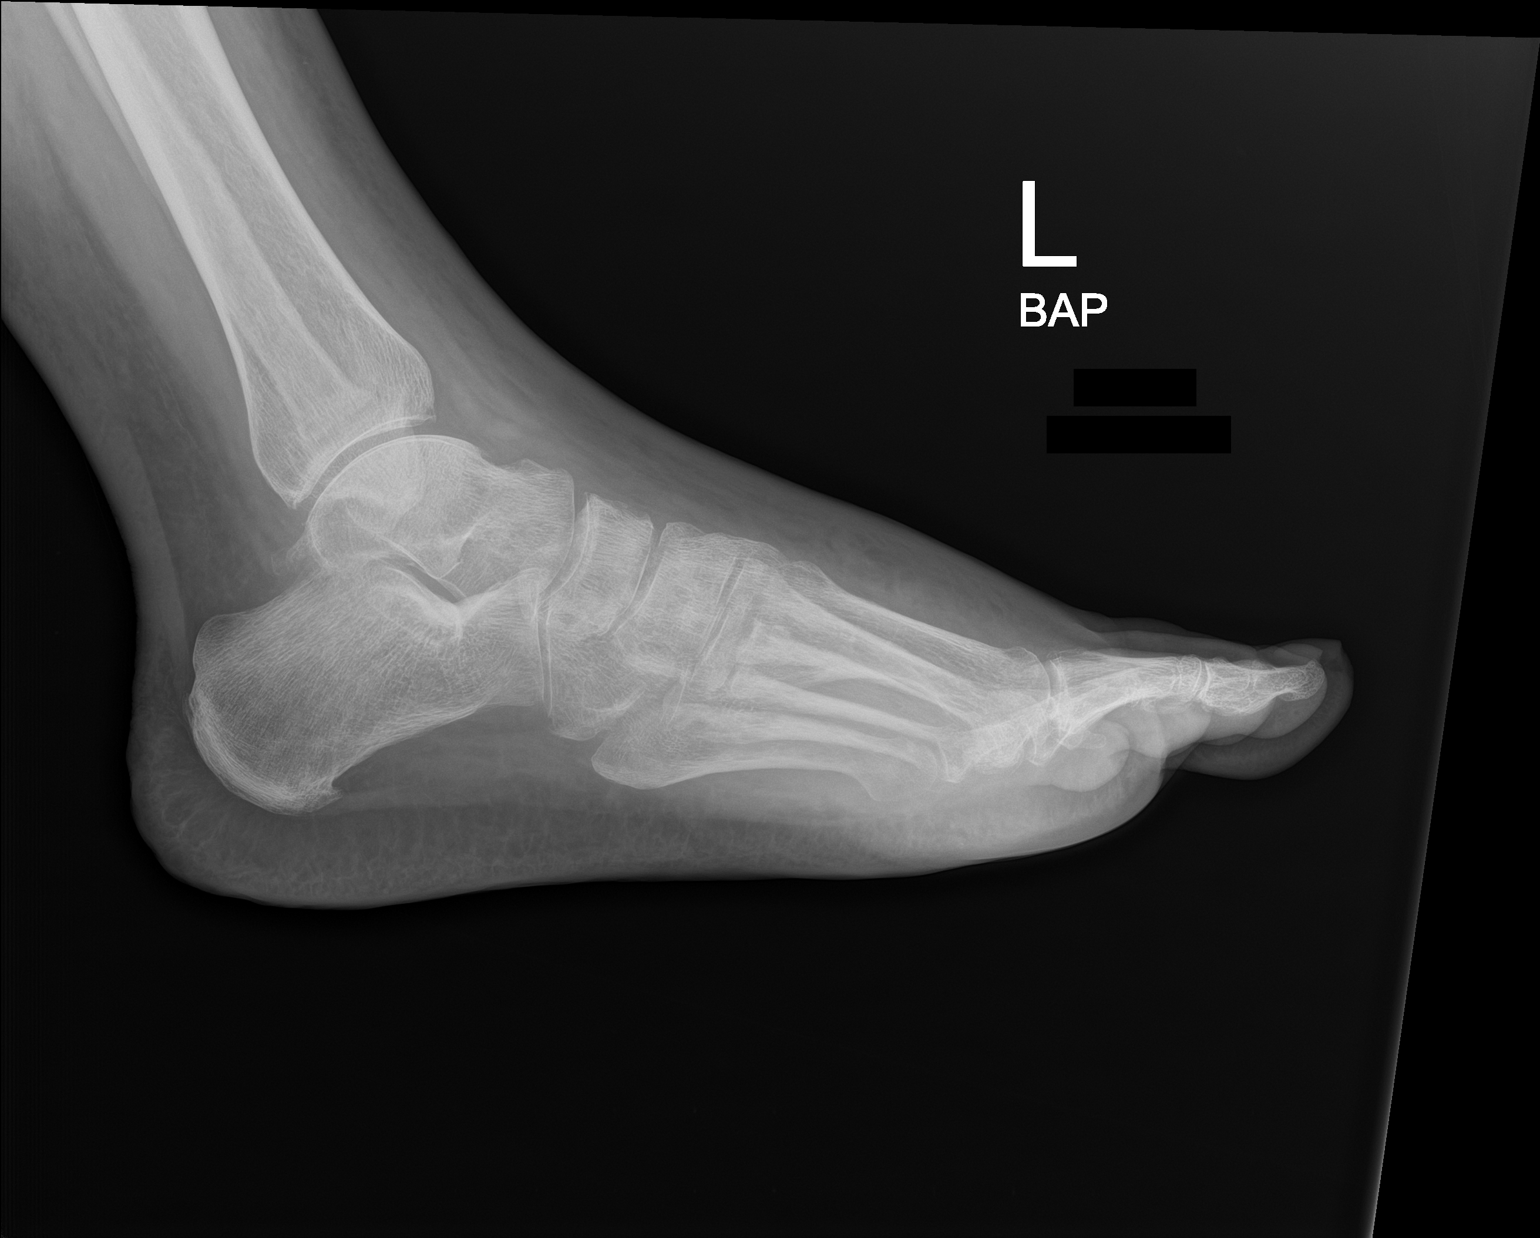

[2 of 2 positions shown; findings below may reference images not displayed]

FINDINGS: There is no evidence of fracture or dislocation. There is no
evidence of arthropathy or other focal bone abnormality. Soft
tissues are unremarkable.
IMPRESSION: Negative.
